# Patient Record
Sex: Female | Born: 1965
Health system: Southern US, Community
[De-identification: ages and names within clinical notes are randomized; demographics above are authoritative.]

## PROBLEM LIST (undated history)

## (undated) DIAGNOSIS — J45909 Unspecified asthma, uncomplicated: Secondary | ICD-10-CM

## (undated) HISTORY — PX: OTHER SURGICAL HISTORY: SHX169

---

## 2016-11-16 ENCOUNTER — Emergency Department
Admission: EM | Admit: 2016-11-16 | Discharge: 2016-11-16 | Disposition: A | Discharge status: Home / Self Care | Payer: No Typology Code available for payment source | Attending: Emergency Medicine | Admitting: Emergency Medicine

## 2016-11-16 ENCOUNTER — Encounter: Payer: Self-pay | Admitting: Emergency Medicine

## 2016-11-16 DIAGNOSIS — Y9389 Activity, other specified: Secondary | ICD-10-CM | POA: Diagnosis not present

## 2016-11-16 DIAGNOSIS — J45909 Unspecified asthma, uncomplicated: Secondary | ICD-10-CM | POA: Diagnosis not present

## 2016-11-16 DIAGNOSIS — Y92410 Unspecified street and highway as the place of occurrence of the external cause: Secondary | ICD-10-CM | POA: Diagnosis not present

## 2016-11-16 DIAGNOSIS — Y999 Unspecified external cause status: Secondary | ICD-10-CM | POA: Diagnosis not present

## 2016-11-16 DIAGNOSIS — F17210 Nicotine dependence, cigarettes, uncomplicated: Secondary | ICD-10-CM | POA: Insufficient documentation

## 2016-11-16 DIAGNOSIS — S199XXA Unspecified injury of neck, initial encounter: Secondary | ICD-10-CM | POA: Diagnosis present

## 2016-11-16 DIAGNOSIS — S161XXA Strain of muscle, fascia and tendon at neck level, initial encounter: Secondary | ICD-10-CM | POA: Diagnosis not present

## 2016-11-16 HISTORY — DX: Unspecified asthma, uncomplicated: J45.909

## 2016-11-16 MED ORDER — CYCLOBENZAPRINE HCL 5 MG PO TABS: 5.0000 mg | ORAL_TABLET | Freq: Three times a day (TID) | ORAL | 0 refills | Status: DC | PRN

## 2016-11-16 MED ORDER — IBUPROFEN 800 MG PO TABS: 800.0000 mg | ORAL_TABLET | Freq: Three times a day (TID) | ORAL | 0 refills | Status: DC | PRN

## 2016-11-16 NOTE — Discharge Instructions (Signed)
Please take medications as prescribed.  Follow-up with orthopedist if no improvement in 1 week.  Return to the emergency department for any increasing pain worsening symptoms or any changes in her health.

## 2016-11-16 NOTE — ED Notes (Signed)
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
## 2019-05-10 ENCOUNTER — Other Ambulatory Visit: Payer: Self-pay

## 2019-05-10 ENCOUNTER — Encounter: Payer: Self-pay | Admitting: *Deleted

## 2019-05-10 DIAGNOSIS — J45909 Unspecified asthma, uncomplicated: Secondary | ICD-10-CM | POA: Insufficient documentation

## 2019-05-10 DIAGNOSIS — K8020 Calculus of gallbladder without cholecystitis without obstruction: Secondary | ICD-10-CM | POA: Diagnosis not present

## 2019-05-10 DIAGNOSIS — F17210 Nicotine dependence, cigarettes, uncomplicated: Secondary | ICD-10-CM | POA: Diagnosis not present

## 2019-05-10 DIAGNOSIS — Z79899 Other long term (current) drug therapy: Secondary | ICD-10-CM | POA: Diagnosis not present

## 2019-05-10 DIAGNOSIS — R350 Frequency of micturition: Secondary | ICD-10-CM | POA: Diagnosis not present

## 2019-05-10 DIAGNOSIS — N200 Calculus of kidney: Secondary | ICD-10-CM | POA: Diagnosis not present

## 2019-05-10 DIAGNOSIS — R109 Unspecified abdominal pain: Secondary | ICD-10-CM | POA: Diagnosis not present

## 2019-05-10 LAB — URINALYSIS, COMPLETE (UACMP) WITH MICROSCOPIC
Bacteria, UA: NONE SEEN
Bilirubin Urine: NEGATIVE
Glucose, UA: NEGATIVE mg/dL
Ketones, ur: 20 mg/dL — AB
Leukocytes,Ua: NEGATIVE
Nitrite: NEGATIVE
Protein, ur: 100 mg/dL — AB
Specific Gravity, Urine: 1.015 (ref 1.005–1.030)
pH: 6 (ref 5.0–8.0)

## 2019-05-10 LAB — COMPREHENSIVE METABOLIC PANEL
ALT: 16 U/L (ref 0–44)
AST: 16 U/L (ref 15–41)
Albumin: 4.5 g/dL (ref 3.5–5.0)
Alkaline Phosphatase: 74 U/L (ref 38–126)
Anion gap: 9 (ref 5–15)
BUN: 8 mg/dL (ref 6–20)
CO2: 26 mmol/L (ref 22–32)
Calcium: 9.6 mg/dL (ref 8.9–10.3)
Chloride: 104 mmol/L (ref 98–111)
Creatinine, Ser: 0.5 mg/dL (ref 0.44–1.00)
GFR calc Af Amer: >60 mL/min (ref >60)
GFR calc non Af Amer: >60 mL/min (ref >60)
Glucose, Bld: 106 mg/dL — ABNORMAL HIGH (ref 70–99)
Potassium: 3.3 mmol/L — ABNORMAL LOW (ref 3.5–5.1)
Sodium: 139 mmol/L (ref 135–145)
Total Bilirubin: 0.8 mg/dL (ref 0.3–1.2)
Total Protein: 8 g/dL (ref 6.5–8.1)

## 2019-05-10 LAB — CBC
HCT: 42.8 % (ref 36.0–46.0)
Hemoglobin: 14.9 g/dL (ref 12.0–15.0)
MCH: 26.2 pg (ref 26.0–34.0)
MCHC: 34.8 g/dL (ref 30.0–36.0)
MCV: 75.2 fL — ABNORMAL LOW (ref 80.0–100.0)
Platelets: 289 10*3/uL (ref 150–400)
RBC: 5.69 MIL/uL — ABNORMAL HIGH (ref 3.87–5.11)
RDW: 15.4 % (ref 11.5–15.5)
WBC: 10.4 10*3/uL (ref 4.0–10.5)
nRBC: 0 % (ref 0.0–0.2)

## 2019-05-10 NOTE — ED Triage Notes (Signed)
[facility] for eval of left flank pain.  Sx for 3 days.  Pt reports nausea.  No hx of kidney stones   Pt alert.

## 2019-05-10 NOTE — ED Notes (Signed)
Pt unable to void at this time. 

## 2019-05-11 ENCOUNTER — Emergency Department: Payer: BLUE CROSS/BLUE SHIELD

## 2019-05-11 ENCOUNTER — Emergency Department
Admission: EM | Admit: 2019-05-11 | Discharge: 2019-05-11 | Disposition: A | Discharge status: Home / Self Care | Payer: BLUE CROSS/BLUE SHIELD | Attending: Emergency Medicine | Admitting: Emergency Medicine

## 2019-05-11 DIAGNOSIS — R109 Unspecified abdominal pain: Secondary | ICD-10-CM | POA: Diagnosis not present

## 2019-05-11 DIAGNOSIS — K8020 Calculus of gallbladder without cholecystitis without obstruction: Secondary | ICD-10-CM

## 2019-05-11 LAB — LIPASE, BLOOD: Lipase: 20 U/L (ref 11–51)

## 2019-05-11 MED ORDER — HYDROCODONE-ACETAMINOPHEN 5-325 MG PO TABS: 1.0000 | ORAL_TABLET | Freq: Four times a day (QID) | ORAL | 0 refills | Status: DC | PRN

## 2019-05-11 MED ORDER — CYCLOBENZAPRINE HCL 10 MG PO TABS: 10.0000 mg | ORAL_TABLET | Freq: Three times a day (TID) | ORAL | 0 refills | Status: DC | PRN

## 2019-05-11 MED ORDER — ONDANSETRON 4 MG PO TBDP
4.0000 mg | ORAL_TABLET | Freq: Once | ORAL | Status: AC
  Administered 2019-05-11: 4 mg via ORAL
  Filled 2019-05-11: qty 1

## 2019-05-11 MED ORDER — ONDANSETRON 4 MG PO TBDP: 4.0000 mg | ORAL_TABLET | Freq: Three times a day (TID) | ORAL | 0 refills | Status: DC | PRN

## 2019-05-11 MED ORDER — OXYCODONE-ACETAMINOPHEN 5-325 MG PO TABS
1.0000 | ORAL_TABLET | Freq: Once | ORAL | Status: AC
  Administered 2019-05-11: 1 via ORAL
  Filled 2019-05-11: qty 1

## 2019-05-11 NOTE — ED Provider Notes (Signed)
[facility] Emergency Department Provider Note _________   First MD Initiated Contact with Patient 05/11/19 0245     (approximate)  I have reviewed the triage vital signs and the nursing notes.   HISTORY  Chief Complaint Flank Pain   HPI Sheila Sanders is a 54 y.o. female presents to the emergency department secondary to a 3-day history of left flank pain  with associated nausea.  Patient denies any vomiting or diarrhea.  Patient denies any fever.  Patient was seen at urgent care earlier today and referred to the emergency department for further evaluation.  Patient denies any history of kidney stones in the past.  Current pain score of 9 out of 10.       Past Medical History:  Diagnosis Date  . Asthma     There are no problems to display for this patient.   Past Surgical History:  Procedure Laterality Date  . ceserian      Prior to Admission medications   Medication Sig Start Date End Date Taking? Authorizing Provider  cyclobenzaprine (FLEXERIL) 10 MG tablet Take 1 tablet (10 mg total) by mouth 3 (three) times daily as needed. 05/11/19   Gregor Hams, MD  cyclobenzaprine (FLEXERIL) 5 MG tablet Take 1-2 tablets (5-10 mg total) by mouth 3 (three) times daily as needed for muscle spasms. 11/16/16   Duanne Guess, PA-C  HYDROcodone-acetaminophen (NORCO/VICODIN) 5-325 MG tablet Take 1 tablet by mouth every 6 (six) hours as needed for moderate pain. 05/11/19   Gregor Hams, MD  ibuprofen (ADVIL,MOTRIN) 800 MG tablet Take 1 tablet (800 mg total) by mouth every 8 (eight) hours as needed. 11/16/16   Duanne Guess, PA-C  ondansetron (ZOFRAN ODT) 4 MG disintegrating tablet Take 1 tablet (4 mg total) by mouth every 8 (eight) hours as needed. 05/11/19   Gregor Hams, MD    Allergies Patient has no known allergies.  No family history on file.  Social History Social History   Tobacco Use  . Smoking status: Current Every Day Smoker      Packs/day: 0.50    Types: Cigarettes  . Smokeless tobacco: Never Used  Substance Use Topics  . Alcohol use: Yes    Comment: occas   . Drug use: No    Review of Systems Constitutional: No fever/chills Eyes: No visual changes. ENT: No sore throat. Cardiovascular: Denies chest pain. Respiratory: Denies shortness of breath. Gastrointestinal: Positive for left flank pain.  No nausea, no vomiting.  No diarrhea.  No constipation. Genitourinary: Negative for dysuria. Musculoskeletal: Negative for neck pain.  Negative for back pain. Integumentary: Negative for rash. Neurological: Negative for headaches, focal weakness or numbness.   ____________________________________________   PHYSICAL EXAM:  VITAL SIGNS: ED Triage Vitals [05/10/19 2113]  Enc Vitals Group     BP (!) 146/93     Pulse Rate 90     Resp 18     Temp 98.7 F (37.1 C)     Temp src      SpO2 98 %     Weight 113.4 kg (250 lb)     Height 1.702 m (5\' 7" )     Head Circumference      Peak Flow      Pain Score 9     Pain Loc      Pain Edu?      Excl. in [place]?     Constitutional: Alert and oriented.  Eyes: Conjunctivae are normal.  Mouth/Throat: Patient  is wearing a mask. Neck: No stridor.  No meningeal signs.   Cardiovascular: Normal rate, regular rhythm. Good peripheral circulation. Grossly normal heart sounds. Respiratory: Normal respiratory effort.  No retractions. Gastrointestinal: Soft and nontender. No distention.  No CVA tenderness Musculoskeletal: No lower extremity tenderness nor edema. No gross deformities of extremities. Neurologic:  Normal speech and language. No gross focal neurologic deficits are appreciated.  Skin:  Skin is warm, dry and intact. Psychiatric: Mood and affect are normal. Speech and behavior are normal.  ____________________________________________   LABS (all labs ordered are listed, but only abnormal results are displayed)  Labs Reviewed  COMPREHENSIVE METABOLIC PANEL -  Abnormal; Notable for the following components:      Result Value   Potassium 3.3 (*)    Glucose, Bld 106 (*)    All other components within normal limits  CBC - Abnormal; Notable for the following components:   RBC 5.69 (*)    MCV 75.2 (*)    All other components within normal limits  URINALYSIS, COMPLETE (UACMP) WITH MICROSCOPIC - Abnormal; Notable for the following components:   Color, Urine YELLOW (*)    APPearance CLEAR (*)    Hgb urine dipstick SMALL (*)    Ketones, ur 20 (*)    Protein, ur 100 (*)    All other components within normal limits  LIPASE, BLOOD   ___________ RADIOLOGY I, [place] N Danyale Ridinger, personally viewed and evaluated these images (plain radiographs) as part of my medical decision making, as well as reviewing the written report by the radiologist.  ED MD interpretation: No renal urethral stones no hydronephrosis no acute findings, cholelithiasis on CT abdomen pelvis impression per radiologist  Official radiology report(s): CT Renal Stone Study  Result Date: 05/11/2019 CLINICAL DATA:  Left flank pain EXAM: CT ABDOMEN AND PELVIS WITHOUT CONTRAST TECHNIQUE: Multidetector CT imaging of the abdomen and pelvis was performed following the standard protocol without IV contrast. COMPARISON:  None. FINDINGS: Lower chest: Lung bases are clear. No effusions. Heart is normal size. Hepatobiliary: Small layering gallstone within the gallbladder. No focal hepatic abnormality. Pancreas: No focal abnormality or ductal dilatation. Spleen: No focal abnormality.  Normal size. Adrenals/Urinary Tract: No adrenal abnormality. No focal renal abnormality. No stones or hydronephrosis. Urinary bladder is unremarkable. Stomach/Bowel: Normal appendix. Stomach, large and small bowel grossly unremarkable. Vascular/Lymphatic: No evidence of aneurysm or adenopathy. Reproductive: Uterus and adnexa unremarkable.  No mass. Other: No free fluid or free air. Musculoskeletal: No acute bony abnormality.  IMPRESSION: No renal or ureteral stones.  No hydronephrosis. No acute findings. Cholelithiasis. Electronically Signed   By: Charlett Nose M.D.   On: 05/11/2019 03:52      Procedures   ____________________________________________   INITIAL IMPRESSION / MDM / ASSESSMENT AND PLAN / ED COURSE  As part of my medical decision making, I reviewed the following data within the electronic MEDICAL RECORD NUMBER   54 year old female presented with above-stated history and physical exam a differential diagnosis including but not limited to ureterolithiasis, pyelonephritis, diverticulitis/diverticulosis.  CT renal study revealed no evidence of ureterolithiasis however did reveal evidence of cholelithiasis patient without any right upper quadrant abdominal pain on exam.  Spoke with the patient at length regarding all clinical findings and notified her of finding of cholelithiasis for which she will be referred to general surgery.  Patient given Percocet and Zofran in the emergency department will be prescribed the same for home.  ____________________________________________  FINAL CLINICAL IMPRESSION(S) / ED DIAGNOSES  Final diagnoses:  Left  flank pain  Calculus of gallbladder without cholecystitis without obstruction     MEDICATIONS GIVEN DURING THIS VISIT:  Medications  ondansetron (ZOFRAN-ODT) disintegrating tablet 4 mg (4 mg Oral Given 05/11/19 0452)  oxyCODONE-acetaminophen (PERCOCET/ROXICET) 5-325 MG per tablet 1 tablet (1 tablet Oral Given 05/11/19 0452)     ED Discharge Orders         Ordered    HYDROcodone-acetaminophen (NORCO/VICODIN) 5-325 MG tablet  Every 6 hours PRN     05/11/19 0457    ondansetron (ZOFRAN ODT) 4 MG disintegrating tablet  Every 8 hours PRN     05/11/19 0457    cyclobenzaprine (FLEXERIL) 10 MG tablet  3 times daily PRN     05/11/19 0522          *Please note:  Sheila Sanders was evaluated in Emergency Department on 05/11/2019 for the symptoms described in the  history of present illness. She was evaluated in the context of the global COVID-19 pandemic, which necessitated consideration that the patient might be at risk for infection with the SARS-CoV-2 virus that causes COVID-19. Institutional protocols and algorithms that pertain to the evaluation of patients at risk for COVID-19 are in a state of rapid change based on information released by regulatory bodies including the CDC and federal and state organizations. These policies and algorithms were followed during the patient's care in the ED.  Some ED evaluations and interventions may be delayed as a result of limited staffing during the pandemic.*  Note:  This document was prepared using Dragon voice recognition software and may include unintentional dictation errors.   Darci Current, MD 05/11/19 2201
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
## 2019-05-26 ENCOUNTER — Ambulatory Visit (INDEPENDENT_AMBULATORY_CARE_PROVIDER_SITE_OTHER): Payer: BLUE CROSS/BLUE SHIELD | Admitting: Internal Medicine

## 2019-05-26 ENCOUNTER — Encounter: Payer: Self-pay | Admitting: Internal Medicine

## 2019-05-26 ENCOUNTER — Other Ambulatory Visit: Payer: Self-pay

## 2019-05-26 VITALS — BP 126/74 | HR 100 | Temp 97.9°F | Resp 16 | Ht 67.0 in | Wt 257.4 lb

## 2019-05-26 DIAGNOSIS — Z800 Family history of malignant neoplasm of digestive organs: Secondary | ICD-10-CM | POA: Diagnosis not present

## 2019-05-26 DIAGNOSIS — K8020 Calculus of gallbladder without cholecystitis without obstruction: Secondary | ICD-10-CM

## 2019-05-26 DIAGNOSIS — Z13220 Encounter for screening for lipoid disorders: Secondary | ICD-10-CM

## 2019-05-26 DIAGNOSIS — J4520 Mild intermittent asthma, uncomplicated: Secondary | ICD-10-CM | POA: Diagnosis not present

## 2019-05-26 DIAGNOSIS — Z131 Encounter for screening for diabetes mellitus: Secondary | ICD-10-CM

## 2019-05-26 DIAGNOSIS — Z09 Encounter for follow-up examination after completed treatment for conditions other than malignant neoplasm: Secondary | ICD-10-CM

## 2019-05-26 DIAGNOSIS — Z7689 Persons encountering health services in other specified circumstances: Secondary | ICD-10-CM

## 2019-05-26 DIAGNOSIS — Z9189 Other specified personal risk factors, not elsewhere classified: Secondary | ICD-10-CM

## 2019-05-26 DIAGNOSIS — Z1329 Encounter for screening for other suspected endocrine disorder: Secondary | ICD-10-CM

## 2019-05-26 DIAGNOSIS — Z6841 Body Mass Index (BMI) 40.0 and over, adult: Secondary | ICD-10-CM

## 2019-05-26 DIAGNOSIS — F17200 Nicotine dependence, unspecified, uncomplicated: Secondary | ICD-10-CM | POA: Insufficient documentation

## 2019-05-26 NOTE — Patient Instructions (Signed)

## 2020-07-16 ENCOUNTER — Ambulatory Visit: Payer: Self-pay

## 2020-07-16 NOTE — Telephone Encounter (Signed)
Patient called, no answer, mailbox is full, unable to leave message. Attempted x 3 by [facility] NT, will route to the office for resolution.   Summary: Ringing in her head   Pt called to report that she has a ringing in her head, a constant radio sound. Pt reports no pain, says this has been for a few weeks   Best contact: [phone]

## 2020-09-18 NOTE — Progress Notes (Addendum)
Name: Sheila Sanders   MRN: [id number]    DOB: 1965-06-02   Date:09/19/2020       Progress Note  Subjective  Chief Complaint  Physical   HPI  Patient presents for annual CPE, and acute issues, aware of potential extra cost.   Tinnitus: Constant buzzing sound in head.  She noticed it a few months ago.  Denies any pain, no trauma.  Worsened by fax machine sound.   Asthma: Has continued to get worse over the summer months.  She noticed wheezing and chest tightness.  She says she has a flare about 2-3 times a week.  She denies waking up in the middle of the night wheezing.  Will start Claritin OTC at night. Start Albuterol inhaler PRN.    Right bottom dental pain:  Dental pain for several weeks. Has an appointment coming up with the dentist in a week.    Diet: Balanced diet, fruits, vegetables, lean meats, fish, dairy and hot dogs. Exercise: stairs a couple times a week. Advised to try and get 150 minutes a week of physical exercise.    [place] Visit from 05/26/2019 in [facility]  AUDIT-C Score 1      Depression: Phq 9 is  negative Depression screen [facility] 2/9 09/19/2020 05/26/2019  Decreased Interest 1 0  Down, Depressed, Hopeless 0 0  PHQ - 2 Score 1 0  Altered sleeping 0 0  Tired, decreased energy 0 0  Change in appetite 0 0  Feeling bad or failure about yourself  0 0  Trouble concentrating 0 0  Moving slowly or fidgety/restless 0 0  Suicidal thoughts 0 0  PHQ-9 Score 1 0  Difficult doing work/chores Not difficult at all Not difficult at all   Hypertension: BP Readings from Last 3 Encounters:  09/19/20 118/72  09/19/20 128/72  05/26/19 126/74   Obesity: Wt Readings from Last 3 Encounters:  09/19/20 258 lb 12.8 oz (117.4 kg)  09/19/20 260 lb 12.8 oz (118.3 kg)  05/26/19 257 lb 6.4 oz (116.8 kg)   BMI Readings from Last 3 Encounters:  09/19/20 40.53 kg/m  09/19/20 40.85 kg/m  05/26/19 40.31 kg/m     Vaccines:   Shingrix: 76-64 y  educated and discussed with patient.  Declined at this time.  Pneumonia:  educated and discussed with patient. Declined at this time. Flu: educated and discussed with patient. Declined at this time.   Hep C Screening: not interested STD testing and prevention (HIV/chl/gon/syphilis): not interested Intimate partner violence:no violence, safe at home Sexual History : one partner, husband Menstrual History/LMP/Abnormal Bleeding: postmenopausal, discussed if she has vaginal bleeding to see [place] or GYN.  Incontinence Symptoms: no incontinence symptoms  Breast cancer:  - Last Mammogram: Ordered today by Dr Gilman Schmidt - BRCA gene screening: no family history of breast cancer  Osteoporosis: Discussed high calcium and vitamin D supplementation, weight bearing exercises  Cervical cancer screening: Done today by Dr Gilman Schmidt  Skin cancer: Discussed monitoring for atypical lesions Colorectal cancer: Color guard ordered today by Dr. Gilman Schmidt Lung cancer:  ordered today by Dr. Gilman Schmidt Low Dose CT Chest recommended if Age 41-80 years, 37 pack-year currently smoking OR have quit w/in 15years. Patient does qualify.     Advanced Care Planning: A voluntary discussion about advance care planning including the explanation and discussion of advance directives.  Discussed health care proxy and Living will, and the patient was able to identify a health care proxy as husband.    Lipids: No  results found for: CHOL No results found for: HDL No results found for: LDLCALC No results found for: TRIG No results found for: CHOLHDL No results found for: LDLDIRECT  Glucose: Glucose, Bld  Date Value Ref Range Status  05/10/2019 106 (H) 70 - 99 mg/dL Final    Comment:    Glucose reference range applies only to samples taken after fasting for at least 8 hours.    Patient Active Problem List   Diagnosis Date Noted   Family history of colon cancer in father 05/26/2019   Calculus of gallbladder without cholecystitis  without obstruction 05/26/2019   Mild intermittent asthma without complication 76/19/5093   Class 3 severe obesity due to excess calories with body mass index (BMI) of 40.0 to 44.9 in adult [facility]) 05/26/2019   Tobacco dependence 05/26/2019   Sedentary lifestyle 05/26/2019    Past Surgical History:  Procedure Laterality Date   CESAREAN SECTION     ceserian      Family History  Problem Relation Age of Onset   Hypertension Mother    Renal Disease Father     Social History   Socioeconomic History   Marital status: Married    Spouse name: Cornell   Number of children: Not on file   Years of education: Not on file   Highest education level: Not on file  Occupational History   Not on file  Tobacco Use   Smoking status: Every Day    Packs/day: 0.50    Types: Cigarettes   Smokeless tobacco: Never  Vaping Use   Vaping Use: Never used  Substance and Sexual Activity   Alcohol use: Yes    Comment: occas    Drug use: No   Sexual activity: Yes    Partners: Male  Other Topics Concern   Not on file  Social History Narrative   Works at [employer] Strain: Not on file  Food Insecurity: Not on file  Transportation Needs: Not on file  Physical Activity: Not on file  Stress: Not on file  Social Connections: Not on file  Intimate Partner Violence: Not At Risk   Fear of Current or Ex-Partner: No   Emotionally Abused: No   Physically Abused: No   Sexually Abused: No    No current outpatient medications on file.  No Known Allergies   ROS  Constitutional: Negative for fever or weight change.  Respiratory: Negative for cough and shortness of breath, positive for wheezing  Cardiovascular: Positive for chest tightness, negative for palpitations.  Gastrointestinal: Negative for abdominal pain, no bowel changes.  Musculoskeletal: Negative for gait problem or joint swelling, positive for right wrist pain  Skin: Positive for rash on  back.  Neurological: Negative for dizziness or headache.  No other specific complaints in a complete review of systems (except as listed in HPI above).   Objective  Vitals:   09/19/20 1322  BP: 118/72  Pulse: 97  Temp: 98.2 F (36.8 C)  SpO2: 96%  Weight: 258 lb 12.8 oz (117.4 kg)  Height: _0  (1.702 m)    Body mass index is 40.53 kg/m.  Physical Exam     Constitutional: Patient appears well-developed and well-nourished. No distress.  HENT: Head: Normocephalic and atraumatic. Ears: B TMs effusion noted, no erythema; Nose: Nose normal. Mouth/Throat: Oropharynx is clear and moist. No oropharyngeal exudate. Right lower dental carries, gum swollen and inflamed. Eyes: Conjunctivae and EOM are normal. Pupils are equal, round, and  reactive to light. No scleral icterus.  Neck: Normal range of motion. Neck supple. No JVD present. No thyromegaly present.  Cardiovascular: Normal rate, regular rhythm and normal heart sounds.  No murmur heard. No BLE edema. Pulmonary/Chest: Effort normal and breath sounds normal. No respiratory distress. Abdominal: Soft. Bowel sounds are normal, no distension. There is no tenderness. no masses Breast: not done FEMALE GENITALIA: not done RECTAL: not done Musculoskeletal: Normal range of motion, no joint effusions. No gross deformities Neurological: he is alert and oriented to person, place, and time. No cranial nerve deficit. Coordination, balance, strength, speech and gait are normal.  Skin: Skin is warm and dry. Rash noted to lower back. No erythema.  Psychiatric: Patient has a normal mood and affect. behavior is normal. Judgment and thought content normal.   Fall Risk: Fall Risk  09/19/2020 05/26/2019  Falls in the past year? 0 0  Number falls in past yr: 0 0  Injury with Fall? 0 0  Follow up - Falls evaluation completed     Functional Status Survey: Is the patient deaf or have difficulty hearing?: No Does the patient have difficulty seeing, even  when wearing glasses/contacts?: Yes Does the patient have difficulty concentrating, remembering, or making decisions?: No Does the patient have difficulty walking or climbing stairs?: No Does the patient have difficulty dressing or bathing?: No Does the patient have difficulty doing errands alone such as visiting a doctor's office or shopping?: No   Assessment & Plan  1. Encounter for wellness examination  - CBC with Differential/Platelet - COMPLETE METABOLIC PANEL WITH GFR - Lipid panel - TSH - Hemoglobin A1c  2. Mild intermittent asthma, unspecified whether complicated  - CBC with Differential/Platelet - COMPLETE METABOLIC PANEL WITH GFR - albuterol (VENTOLIN HFA) 108 (90 Base) MCG/ACT inhaler; Inhale 2 puffs into the lungs every 6 (six) hours as needed for wheezing or shortness of breath.  Dispense: 8 g; Refill: 0 - start loratidine OTC 3. Screening for thyroid disorder  - TSH  4. Screening for diabetes mellitus  - CBC with Differential/Platelet - COMPLETE METABOLIC PANEL WITH GFR - Hemoglobin A1c  5. Screening for lipid disorders  - Lipid panel  6. Tinnitus of both ears/ acute middle ear effusion -start loratidine OTC, maybe associated with bilateral ear effusion  7. Dental infection - she is also going to see the dentist in a week - amoxicillin-clavulanate (AUGMENTIN) 875-125 MG tablet; Take 1 tablet by mouth 2 (two) times daily for 14 days.  Dispense: 28 tablet; Refill: 0   8. Screening for deficiency anemia - CBC with Differential/Platelet    -USPSTF grade A and B recommendations reviewed with patient; age-appropriate recommendations, preventive care, screening tests, etc discussed and encouraged; healthy living encouraged; see AVS for patient education given to patient -Discussed importance of 150 minutes of physical activity weekly, eat two servings of fish weekly, eat one serving of tree nuts ( cashews, pistachios, pecans, almonds.Marland Kitchen) every other day, eat 6  servings of fruit/vegetables daily and drink plenty of water and avoid sweet beverages.

## 2020-09-19 ENCOUNTER — Encounter: Payer: Self-pay | Admitting: Obstetrics and Gynecology

## 2020-09-19 ENCOUNTER — Encounter: Payer: Self-pay | Admitting: Nurse Practitioner

## 2020-09-19 ENCOUNTER — Ambulatory Visit (INDEPENDENT_AMBULATORY_CARE_PROVIDER_SITE_OTHER): Payer: BC Managed Care – PPO | Admitting: Obstetrics and Gynecology

## 2020-09-19 ENCOUNTER — Ambulatory Visit (INDEPENDENT_AMBULATORY_CARE_PROVIDER_SITE_OTHER): Payer: BC Managed Care – PPO | Admitting: Nurse Practitioner

## 2020-09-19 ENCOUNTER — Other Ambulatory Visit: Payer: Self-pay

## 2020-09-19 ENCOUNTER — Other Ambulatory Visit (HOSPITAL_COMMUNITY)
Admission: RE | Admit: 2020-09-19 | Discharge: 2020-09-19 | Disposition: A | Discharge status: Home / Self Care | Payer: BC Managed Care – PPO | Source: Ambulatory Visit | Attending: Obstetrics and Gynecology | Admitting: Obstetrics and Gynecology

## 2020-09-19 VITALS — BP 128/72 | Ht 67.0 in | Wt 260.8 lb

## 2020-09-19 VITALS — BP 118/72 | HR 97 | Temp 98.2°F | Ht 67.0 in | Wt 258.8 lb

## 2020-09-19 DIAGNOSIS — H65193 Other acute nonsuppurative otitis media, bilateral: Secondary | ICD-10-CM

## 2020-09-19 DIAGNOSIS — Z1231 Encounter for screening mammogram for malignant neoplasm of breast: Secondary | ICD-10-CM | POA: Diagnosis not present

## 2020-09-19 DIAGNOSIS — Z122 Encounter for screening for malignant neoplasm of respiratory organs: Secondary | ICD-10-CM

## 2020-09-19 DIAGNOSIS — Z131 Encounter for screening for diabetes mellitus: Secondary | ICD-10-CM

## 2020-09-19 DIAGNOSIS — Z0000 Encounter for general adult medical examination without abnormal findings: Secondary | ICD-10-CM

## 2020-09-19 DIAGNOSIS — Z01419 Encounter for gynecological examination (general) (routine) without abnormal findings: Secondary | ICD-10-CM

## 2020-09-19 DIAGNOSIS — Z1151 Encounter for screening for human papillomavirus (HPV): Secondary | ICD-10-CM | POA: Insufficient documentation

## 2020-09-19 DIAGNOSIS — Z124 Encounter for screening for malignant neoplasm of cervix: Secondary | ICD-10-CM | POA: Diagnosis not present

## 2020-09-19 DIAGNOSIS — Z118 Encounter for screening for other infectious and parasitic diseases: Secondary | ICD-10-CM | POA: Insufficient documentation

## 2020-09-19 DIAGNOSIS — J4520 Mild intermittent asthma, uncomplicated: Secondary | ICD-10-CM

## 2020-09-19 DIAGNOSIS — Z1239 Encounter for other screening for malignant neoplasm of breast: Secondary | ICD-10-CM

## 2020-09-19 DIAGNOSIS — Z13220 Encounter for screening for lipoid disorders: Secondary | ICD-10-CM

## 2020-09-19 DIAGNOSIS — K047 Periapical abscess without sinus: Secondary | ICD-10-CM

## 2020-09-19 DIAGNOSIS — H9313 Tinnitus, bilateral: Secondary | ICD-10-CM

## 2020-09-19 DIAGNOSIS — Z1329 Encounter for screening for other suspected endocrine disorder: Secondary | ICD-10-CM | POA: Diagnosis not present

## 2020-09-19 DIAGNOSIS — Z130 Encounter for screening for diseases of the blood and blood-forming organs and certain disorders involving the immune mechanism: Secondary | ICD-10-CM

## 2020-09-19 DIAGNOSIS — Z1211 Encounter for screening for malignant neoplasm of colon: Secondary | ICD-10-CM

## 2020-09-19 MED ORDER — ALBUTEROL SULFATE HFA 108 (90 BASE) MCG/ACT IN AERS: 2.0000 | INHALATION_SPRAY | Freq: Four times a day (QID) | RESPIRATORY_TRACT | 0 refills | Status: DC | PRN

## 2020-09-19 MED ORDER — AMOXICILLIN-POT CLAVULANATE 875-125 MG PO TABS: 1.0000 | ORAL_TABLET | Freq: Two times a day (BID) | ORAL | 0 refills | Status: AC

## 2020-09-19 NOTE — Progress Notes (Signed)
Gynecology Annual Exam  PCP: Berniece Salines, FNP  Chief Complaint:  Chief Complaint  Patient presents with   Gynecologic Exam    History of Present Illness: Patient is a 55 y.o. [id number] presents for annual exam. The patient has no complaints today.   LMP: No LMP recorded. Patient is postmenopausal. She denies postmenopausal bleeding or spotting  The patient is sexually active. She reports dyspareunia.  Postcoital Bleeding: no   The patient does perform self breast exams.  There is notable family history of breast or ovarian cancer in her family.  The patient has regular exercise: goes up and down steps at home when doing chores.   The patient denies current symptoms of depression.   Review of Systems: Review of Systems  Constitutional:  Negative for chills, fever, malaise/fatigue and weight loss.  HENT:  Positive for hearing loss. Negative for congestion and sinus pain.   Eyes:  Negative for blurred vision and double vision.  Respiratory:  Positive for cough, shortness of breath and wheezing. Negative for sputum production.   Cardiovascular:  Negative for chest pain, palpitations, orthopnea and leg swelling.  Gastrointestinal:  Negative for abdominal pain, constipation, diarrhea, nausea and vomiting.  Genitourinary:  Negative for dysuria, flank pain, frequency, hematuria and urgency.  Musculoskeletal:  Negative for back pain, falls and joint pain.  Skin:  Negative for itching and rash.  Neurological:  Positive for headaches. Negative for dizziness.  Psychiatric/Behavioral:  Negative for depression, substance abuse and suicidal ideas. The patient is not nervous/anxious.    Past Medical History:  Past Medical History:  Diagnosis Date   Asthma     Past Surgical History:  Past Surgical History:  Procedure Laterality Date   CESAREAN SECTION     ceserian      Gynecologic History:  No LMP recorded. Patient is postmenopausal. Last pap: None in system.  Patient  believes her last Pap smear was in 2010. Last mammogram: No mammogram results in the system.  Patient believes her last mammogram was in 2008.    Obstetric History: [id number]  Family History:  Family History  Problem Relation Age of Onset   Hypertension Mother    Renal Disease Father     Social History:  Social History   Socioeconomic History   Marital status: Married    Spouse name: Cornell   Number of children: Not on file   Years of education: Not on file   Highest education level: Not on file  Occupational History   Not on file  Tobacco Use   Smoking status: Every Day    Packs/day: 0.50    Types: Cigarettes   Smokeless tobacco: Never  Vaping Use   Vaping Use: Never used  Substance and Sexual Activity   Alcohol use: Yes    Comment: occas    Drug use: No   Sexual activity: Yes    Partners: Male  Other Topics Concern   Not on file  Social History Narrative   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 Authorizing Provider  cyclobenzaprine (FLEXERIL) 10 MG tablet Take 1 tablet (  10 mg total) by mouth 3 (three) times daily as needed. Patient not taking: Reported on 05/26/2019 05/11/19   Darci Current, MD  cyclobenzaprine (FLEXERIL) 5 MG tablet Take 1-2 tablets (5-10 mg total) by mouth 3 (three) times daily as needed for muscle spasms. Patient not taking: Reported on 05/26/2019 11/16/16   Evon Slack, PA-C  HYDROcodone-acetaminophen (NORCO/VICODIN) 5-325 MG tablet Take 1 tablet by mouth every 6 (six) hours as needed for moderate pain. Patient not taking: Reported on 05/26/2019 05/11/19   Darci Current,  MD  ibuprofen (ADVIL,MOTRIN) 800 MG tablet Take 1 tablet (800 mg total) by mouth every 8 (eight) hours as needed. Patient not taking: Reported on 05/26/2019 11/16/16   Evon Slack, PA-C  ondansetron (ZOFRAN ODT) 4 MG disintegrating tablet Take 1 tablet (4 mg total) by mouth every 8 (eight) hours as needed. Patient not taking: Reported on 05/26/2019 05/11/19   Darci Current, MD    Physical Exam Vitals: Blood pressure 128/72, height 5\' 7"  (1.702 m), weight 260 lb 12.8 oz (118.3 kg).  Physical Exam Constitutional:      Appearance: She is well-developed.  Genitourinary:     Genitourinary Comments: External: Normal appearing vulva. No lesions noted.  Speculum examination: Normal appearing cervix. No blood in the vaginal vault. No discharge.  Bimanual examination: Uterus midline, non-tender, normal in size, shape and contour.  No CMT. No adnexal masses. No adnexal tenderness. Pelvis not fixed.  Breast Exam: breast equal without skin changes, nipple discharge, breast lump or enlarged lymph nodes   HENT:     Head: Normocephalic and atraumatic.  Neck:     Thyroid: No thyromegaly.  Cardiovascular:     Rate and Rhythm: Normal rate and regular rhythm.     Heart sounds: Normal heart sounds.  Pulmonary:     Effort: Pulmonary effort is normal.     Breath sounds: Normal breath sounds.  Abdominal:     General: Bowel sounds are normal. There is no distension.     Palpations: Abdomen is soft. There is no mass.  Musculoskeletal:     Cervical back: Neck supple.  Neurological:     Mental Status: She is alert and oriented to person, place, and time.  Skin:    General: Skin is warm and dry.  Psychiatric:        Behavior: Behavior normal.        Thought Content: Thought content normal.        Judgment: Judgment normal.  Vitals reviewed.     Female chaperone present for pelvic and breast  portions of the physical exam  Assessment: 55 y.o. 41 routine annual exam  Plan: Problem List  Items Addressed This Visit   None Visit Diagnoses     Encounter for annual routine gynecological examination    -  Primary   Health maintenance examination       Breast cancer screening by mammogram       Cervical cancer screening       Colon cancer screening       Encounter for gynecological examination without abnormal finding       Encounter for screening breast examination           1) Mammogram - recommend yearly screening mammogram.  Mammogram was ordered today  2) STI screening was offered and declined.  3) ASCCP guidelines and rational discussed.  Pap smear performed today.   4) Colonoscopy -- recommended. Patient adverse to prep and would like cologuard  testing, order placed.   5) Routine healthcare maintenance including cholesterol, diabetes screening discussed managed by PCP. She has a visit with them today  6) Osteoporosis screening - no increased risk factors  7) Lung cancer screening-patient reports that she has smoked 1 to 2 packs of cigarettes daily for the last 40 years.  She is currently a smoker.  She is not interested in quitting.  Referral placed for lung cancer screening.  Adelene Idler MD, [facility] 09/19/2020 8:39 AM

## 2020-09-19 NOTE — Patient Instructions (Signed)
Volteran gel  Clairtin

## 2020-09-19 NOTE — Patient Instructions (Addendum)
Vaginal/ Vulvar Moisturizer Use 3-5 times a week at bedtime Hyalo Gyn Revaree Replens Lisette Grinder Key-E suppositories Vitamin E oil, olive oil, coconut oil   Water-based Lubricants  Astroglide [place] Jelly  Luvena Aquagel  Silicone- based Lubricants Pjur PINK Astroglide silicone Uberlube            Institute of Medicine Recommended Dietary Allowances for Calcium and Vitamin D  Age (yr) Calcium Recommended Dietary Allowance (mg/day) Vitamin D Recommended Dietary Allowance (international units/day)  9-18 1,300 600  19-50 1,000 600  51-70 1,200 600  71 and older 1,200 800  Data from Institute of Medicine. Dietary reference intakes: calcium, vitamin D. [place], [place]: [employer]; 2011.      Exercising to Stay Healthy To become healthy and stay healthy, it is recommended that you do moderate-intensity and vigorous-intensity exercise. You can tell that you are exercising at a moderate intensity if your heart starts beating faster and you start breathing faster but can still hold a conversation. You can tell that you are exercising at a vigorous intensity if you are breathing much harder and faster and cannot hold a conversation while exercising. How can exercise benefit me? Exercising regularly is important. It has many health benefits, such as: Improving overall fitness, flexibility, and endurance. Increasing bone density. Helping with weight control. Decreasing body fat. Increasing muscle strength and endurance. Reducing stress and tension, anxiety, depression, or anger. Improving overall health. What guidelines should I follow while exercising? Before you start a new exercise program, talk with your health care provider. Do not exercise so much that you hurt yourself, feel dizzy, or get very short of breath. Wear comfortable clothes and wear shoes with good support. Drink plenty of water while you exercise to prevent dehydration or heat stroke. Work out  until your breathing and your heartbeat get faster (moderate intensity). How often should I exercise? Choose an activity that you enjoy, and set realistic goals. Your health care provider can help you make an activity plan that is individually designed and works best for you. Exercise regularly as told by your health care provider. This may include: Doing strength training two times a week, such as: Lifting weights. Using resistance bands. Push-ups. Sit-ups. Yoga. Doing a certain intensity of exercise for a given amount of time. Choose from these options: A total of 150 minutes of moderate-intensity exercise every week. A total of 75 minutes of vigorous-intensity exercise every week. A mix of moderate-intensity and vigorous-intensity exercise every week. Children, pregnant women, people who have not exercised regularly, people who are overweight, and older adults may need to talk with a health care provider about what activities are safe to perform. If you have a medical condition, be sure to talk with your health care provider before you start a new exercise program. What are some exercise ideas? Moderate-intensity exercise ideas include: Walking 1 mile (1.6 km) in about 15 minutes. Biking. Hiking. Golfing. Dancing. Water aerobics. Vigorous-intensity exercise ideas include: Walking 4.5 miles (7.2 km) or more in about 1 hour. Jogging or running 5 miles (8 km) in about 1 hour. Biking 10 miles (16.1 km) or more in about 1 hour. Lap swimming. Roller-skating or in-line skating. Cross-country skiing. Vigorous competitive sports, such as football, basketball, and soccer. Jumping rope. Aerobic dancing. What are some everyday activities that can help me get exercise? Yard work, such as: Child psychotherapist. Raking and bagging leaves. Washing your car. Pushing a stroller. Shoveling snow. Gardening. Washing windows or floors. How can I  be more active in my day-to-day activities? Use  stairs instead of an elevator. Take a walk during your lunch break. If you drive, park your car farther away from your work or school. If you take public transportation, get off one stop early and walk the rest of the way. Stand up or walk around during all of your indoor phone calls. Get up, stretch, and walk around every 30 minutes throughout the day. Enjoy exercise with a friend. Support to continue exercising will help you keep a regular routine of activity. Where to find more information You can find more information about exercising to stay healthy from: U.S. Department of Health and Human Services: ThisPath.fi Centers for Disease Control and Prevention (CDC): FootballExhibition.com.br Summary Exercising regularly is important. It will improve your overall fitness, flexibility, and endurance. Regular exercise will also improve your overall health. It can help you control your weight, reduce stress, and improve your bone density. Do not exercise so much that you hurt yourself, feel dizzy, or get very short of breath. Before you start a new exercise program, talk with your health care provider. This information is not intended to replace advice given to you by your health care provider. Make sure you discuss any questions you have with your health care provider. Document Revised: 05/03/2020 Document Reviewed: 05/03/2020 Elsevier Patient Education  2022 Elsevier Inc. Budget-Friendly Healthy Eating There are many ways to save money at the grocery store and continue to eat healthy. You can be successful if you: Plan meals according to your budget. Make a grocery list and only purchase food according to your grocery list. Prepare food yourself at home. What are tips for following this plan? Reading food labels Compare food labels between brand name foods and the store brand. Often the nutritional value is the same, but the store brand is lower cost. Look for products that do not have added sugar, fat,  or salt (sodium). These often cost the same but are healthier for you. Products may be labeled as: Sugar-free. Nonfat. Low-fat. Sodium-free. Low-sodium. Look for lean ground beef labeled as at least 92% lean and 8% fat. Shopping  Buy only the items on your grocery list and go only to the areas of the store that have the items on your list. Use coupons only for foods and brands you normally buy. Avoid buying items you wouldn't normally buy simply because they are on sale. Check online and in newspapers for weekly deals. Buy healthy items from the bulk bins when available, such as herbs, spices, flour, pasta, nuts, and dried fruit. Buy fruits and vegetables that are in season. Prices are usually lower on in-season produce. Look at the unit price on the price tag. Use it to compare different brands and sizes to find out which item is the best deal. Choose healthy items that are often low-cost, such as carrots, potatoes, apples, bananas, and oranges. Dried or canned beans are a low-cost protein source. Buy in bulk and freeze extra food. Items you can buy in bulk include meats, fish, poultry, frozen fruits, and frozen vegetables. Avoid buying "ready-to-eat" foods, such as pre-cut fruits and vegetables and pre-made salads. If possible, shop around to discover where you can find the best prices. Consider other retailers such as dollar stores, larger [employer], local fruit and vegetable stands, and farmers markets. Do not shop when you are hungry. If you shop while hungry, it may be hard to stick to your list and budget. Resist impulse buying. Use your  grocery list as your official plan for the week. Buy a variety of vegetables and fruits by purchasing fresh, frozen, and canned items. Look at the top and bottom shelves for deals. Foods at eye level (eye level of an adult or child) are usually more expensive. Be efficient with your time when shopping. The more time you spend at the store, the  more money you are likely to spend. To save money when choosing more expensive foods like meats and dairy: Choose cheaper cuts of meat, such as bone-in chicken thighs and drumsticks instead of skinless and boneless chicken. When you are ready to prepare the chicken, you can remove the skin yourself to make it healthier. Choose lean meats like chicken or [place] instead of beef. Choose canned seafood, such as tuna, salmon, or sardines. Buy eggs as a low-cost source of protein. Buy dried beans and peas, such as lentils, split peas, or kidney beans instead of meats. Dried beans and peas are a good alternative source of protein. Buy the larger tubs of yogurt instead of individual-sized containers. Choose water instead of sodas and other sweetened beverages. Avoid buying chips, cookies, and other "junk food." These items are usually expensive and not healthy. Cooking Make extra food and freeze the extras in meal-sized containers or in individual portions for fast meals and snacks. Pre-cook on days when you have extra time to prepare meals in advance. You can keep these meals in the fridge or freezer and reheat for a quick meal. When you come home from the grocery store, wash, peel, and cut fruits and vegetables so they are ready to use and eat. This will help reduce food waste. Meal planning Do not eat out or get fast food. Prepare food at home. Make a grocery list and make sure to bring it with you to the store. If you have a smart phone, you could use your phone to create your shopping list. Plan meals and snacks according to a grocery list and budget you create. Use leftovers in your meal plan for the week. Look for recipes where you can cook once and make enough food for two meals. Prepare budget-friendly types of meals like stews, casseroles, and stir-fry dishes. Try some meatless meals or try "no cook" meals like salads. Make sure that half your plate is filled with fruits or vegetables. Choose  from fresh, frozen, or canned fruits and vegetables. If eating canned, remember to rinse them before eating. This will remove any excess salt added for packaging. Summary Eating healthy on a budget is possible if you plan your meals according to your budget, purchase according to your budget and grocery list, and prepare food yourself. Tips for buying more food on a limited budget include buying generic brands, using coupons only for foods you normally buy, and buying healthy items from the bulk bins when available. Tips for buying cheaper food to replace expensive food include choosing cheaper, lean cuts of meat, and buying dried beans and peas. This information is not intended to replace advice given to you by your health care provider. Make sure you discuss any questions you have with your health care provider. Document Revised: 10/19/2019 Document Reviewed: 10/19/2019 Elsevier Patient Education  2022 Elsevier Inc. Bone Health Bones protect organs, store calcium, anchor muscles, and support the whole body. Keeping your bones strong is important, especially as you get older. You can take actions to help keep your bones strong and healthy. Why is keeping my bones healthy important? Keeping your  bones healthy is important because your body constantly replaces bone cells. Cells get old, and new cells take their place. As we age, we lose bone cells because the body may not be able to make enough new cells to replace the old cells. The amount of bone cells and bone tissue you have is referred to as bone mass. The higher your bone mass, the stronger your bones. The aging process leads to an overall loss of bone mass in the body, which can increase the likelihood of: Joint pain and stiffness. Broken bones. A condition in which the bones become weak and brittle (osteoporosis). A large decline in bone mass occurs in older adults. In women, it occurs about the time of menopause. What actions can I take to  keep my bones healthy? Good health habits are important for maintaining healthy bones. This includes eating nutritious foods and exercising regularly. To have healthy bones, you need to get enough of the right minerals and vitamins. Most nutrition experts recommend getting these nutrients from the foods that you eat. In some cases, taking supplements may also be recommended. Doing certain types of exercise is also important for bone health. What are the nutritional recommendations for healthy bones? Eating a well-balanced diet with plenty of calcium and vitamin D will help to protect your bones. Nutritional recommendations vary from person to person. Ask your health care provider what is healthy for you. Here are some general guidelines. Get enough calcium Calcium is the most important (essential) mineral for bone health. Most people can get enough calcium from their diet, but supplements may be recommended for people who are at risk for osteoporosis. Good sources of calcium include: Dairy products, such as low-fat or nonfat milk, cheese, and yogurt. Dark green leafy vegetables, such as bok choy and broccoli. Calcium-fortified foods, such as orange juice, cereal, bread, soy beverages, and tofu products. Nuts, such as almonds. Follow these recommended amounts for daily calcium intake: Children, age 55-3: 700 mg. Children, age 40-8: 1,000 mg. Children, age 309-13: 1,300 mg. Teens, age 550914-18: 1,300 mg. Adults, age 54-50: 1,000 mg. Adults, age 54-70: Men: 1,000 mg. Women: 1,200 mg. Adults, age 54 or older: 1,200 mg. Pregnant and breastfeeding females: Teens: 1,300 mg. Adults: 1,000 mg. Get enough vitamin D Vitamin D is the most essential vitamin for bone health. It helps the body absorb calcium. Sunlight stimulates the skin to make vitamin D, so be sure to get enough sunlight. If you live in a cold climate or you do not get outside often, your health care provider may recommend that you take vitamin  D supplements. Good sources of vitamin D in your diet include: Egg yolks. Saltwater fish. Milk and cereal fortified with vitamin D. Follow these recommended amounts for daily vitamin D intake: Children and teens, age 55-18: 600 international units. Adults, age 54 or younger: 400-800 international units. Adults, age 40851 or older: 800-1,000 international units. Get other important nutrients Other nutrients that are important for bone health include: Phosphorus. This mineral is found in meat, poultry, dairy foods, nuts, and legumes. The recommended daily intake for adult men and adult women is 700 mg. Magnesium. This mineral is found in seeds, nuts, dark green vegetables, and legumes. The recommended daily intake for adult men is 400-420 mg. For adult women, it is 310-320 mg. Vitamin K. This vitamin is found in green leafy vegetables. The recommended daily intake is 120 mg for adult men and 90 mg for adult women. What type of physical activity  is best for building and maintaining healthy bones? Weight-bearing and strength-building activities are important for building and maintaining healthy bones. Weight-bearing activities cause muscles and bones to work against gravity. Strength-building activities increase the strength of the muscles that support bones. Weight-bearing and muscle-building activities include: Walking and hiking. Jogging and running. Dancing. Gym exercises. Lifting weights. Tennis and racquetball. Climbing stairs. Aerobics. Adults should get at least 30 minutes of moderate physical activity on most days. Children should get at least 60 minutes of moderate physical activity on most days. Ask your health care provider what type of exercise is best for you. How can I find out if my bone mass is low? Bone mass can be measured with an X-ray test called a bone mineral density (BMD) test. This test is recommended for all women who are age 35 or older. It may also be recommended  for: Men who are age 62 or older. People who are at risk for osteoporosis because of: Having bones that break easily. Having a long-term disease that weakens bones, such as kidney disease or rheumatoid arthritis. Having menopause earlier than normal. Taking medicine that weakens bones, such as steroids, thyroid hormones, or hormone treatment for breast cancer or prostate cancer. Smoking. Drinking three or more alcoholic drinks a day. If you find that you have a low bone mass, you may be able to prevent osteoporosis or further bone loss by changing your diet and lifestyle. Where can I find more information? For more information, check out the following websites: National Osteoporosis Foundation: https://carlson-fletcher.info/ [employer]: www.bones.http://www.myers.net/ International Osteoporosis Foundation: Investment banker, operational.iofbonehealth.org Summary The aging process leads to an overall loss of bone mass in the body, which can increase the likelihood of broken bones and osteoporosis. Eating a well-balanced diet with plenty of calcium and vitamin D will help to protect your bones. Weight-bearing and strength-building activities are also important for building and maintaining strong bones. Bone mass can be measured with an X-ray test called a bone mineral density (BMD) test. This information is not intended to replace advice given to you by your health care provider. Make sure you discuss any questions you have with your health care provider. Document Revised: 02/01/2017 Document Reviewed: 02/01/2017 Elsevier Patient Education  2022 Elsevier Inc.   Managing the Challenge of Quitting Smoking Quitting smoking is a physical and mental challenge. You will face cravings, withdrawal symptoms, and temptation. Before quitting, work with your health care provider to make a plan that can help you manage quitting. Preparation can help you quit and keep you from giving in. How to manage lifestyle changes Managing  stress Stress can make you want to smoke, and wanting to smoke may cause stress. It is important to find ways to manage your stress. You might try some of the following: Practice relaxation techniques. Breathe slowly and deeply, in through your nose and out through your mouth. Listen to music. Soak in a bath or take a shower. Imagine a peaceful place or vacation. Get some support. Talk with family or friends about your stress. Join a support group. Talk with a counselor or therapist. Get some physical activity. Go for a walk, run, or bike ride. Play a favorite sport. Practice yoga.  Medicines Talk with your health care provider about medicines that might help you deal with cravings and make quitting easier for you. Relationships Social situations can be difficult when you are quitting smoking. To manage this, you can: Avoid parties and other social situations where people might be smoking.  Avoid alcohol. Leave right away if you have the urge to smoke. Explain to your family and friends that you are quitting smoking. Ask for support and let them know you might be a bit grumpy. Plan activities where smoking is not an option. General instructions Be aware that many people gain weight after they quit smoking. However, not everyone does. To keep from gaining weight, have a plan in place before you quit and stick to the plan after you quit. Your plan should include: Having healthy snacks. When you have a craving, it may help to: Eat popcorn, carrots, celery, or other cut vegetables. Chew sugar-free gum. Changing how you eat. Eat small portion sizes at meals. Eat 4-6 small meals throughout the day instead of 1-2 large meals a day. Be mindful when you eat. Do not watch television or do other things that might distract you as you eat. Exercising regularly. Make time to exercise each day. If you do not have time for a long workout, do short bouts of exercise for 5-10 minutes several times a  day. Do some form of strengthening exercise, such as weight lifting. Do some exercise that gets your heart beating and causes you to breathe deeply, such as walking fast, running, swimming, or biking. This is very important. Drinking plenty of water or other low-calorie or no-calorie drinks. Drink 6-8 glasses of water daily.  How to recognize withdrawal symptoms Your body and mind may experience discomfort as you try to get used to not having nicotine in your system. These effects are called withdrawal symptoms. They may include: Feeling hungrier than normal. Having trouble concentrating. Feeling irritable or restless. Having trouble sleeping. Feeling depressed. Craving a cigarette. To manage withdrawal symptoms: Avoid places, people, and activities that trigger your cravings. Remember why you want to quit. Get plenty of sleep. Avoid coffee and other caffeinated drinks. These may worsen some of your symptoms. These symptoms may surprise you. But be assured that they are normal to have when quitting smoking. How to manage cravings Come up with a plan for how to deal with your cravings. The plan should include the following: A definition of the specific situation you want to deal with. An alternative action you will take. A clear idea for how this action will help. The name of someone who might help you with this. Cravings usually last for 5-10 minutes. Consider taking the following actions to help you with your plan to deal with cravings: Keep your mouth busy. Chew sugar-free gum. Suck on hard candies or a straw. Brush your teeth. Keep your hands and body busy. Change to a different activity right away. Squeeze or play with a ball. Do an activity or a hobby, such as making bead jewelry, practicing needlepoint, or working with wood. Mix up your normal routine. Take a short exercise break. Go for a quick walk or run up and down stairs. Focus on doing something kind or helpful for  someone else. Call a friend or family member to talk during a craving. Join a support group. Contact a quitline. Where to find support To get help or find a support group: Call the National Cancer Institute's Smoking Quitline: 1-800-QUIT NOW 785-399-6725) Visit the website of the Substance Abuse and Mental Health Services Administration: SkateOasis.com.pt Text QUIT to SmokefreeTXT: 998338 Where to find more information Visit these websites to find more information on quitting smoking: National Cancer Institute: www.smokefree.gov American Lung Association: www.lung.org American Cancer Society: www.cancer.org Centers for Disease Control and Prevention: FootballExhibition.com.br American  Heart Association: www.heart.org Contact a health care provider if: You want to change your plan for quitting. The medicines you are taking are not helping. Your eating feels out of control or you cannot sleep. Get help right away if: You feel depressed or become very anxious. Summary Quitting smoking is a physical and mental challenge. You will face cravings, withdrawal symptoms, and temptation to smoke again. Preparation can help you as you go through these challenges. Try different techniques to manage stress, handle social situations, and prevent weight gain. You can deal with cravings by keeping your mouth busy (such as by chewing gum), keeping your hands and body busy, calling family or friends, or contacting a quitline for people who want to quit smoking. You can deal with withdrawal symptoms by avoiding places where people smoke, getting plenty of rest, and avoiding drinks with caffeine. This information is not intended to replace advice given to you by your health care provider. Make sure you discuss any questions you have with your health care provider. Document Revised: 10/25/2018 Document Reviewed: 10/25/2018 Elsevier Patient Education  2022 [employer].

## 2020-09-20 LAB — CBC WITH DIFFERENTIAL/PLATELET
Absolute Monocytes: 668 cells/uL (ref 200–950)
Basophils Absolute: 38 cells/uL (ref 0–200)
Basophils Relative: 0.5 %
Eosinophils Absolute: 98 cells/uL (ref 15–500)
Eosinophils Relative: 1.3 %
HCT: 42.3 % (ref 35.0–45.0)
Hemoglobin: 14.2 g/dL (ref 11.7–15.5)
Lymphs Abs: 1943 cells/uL (ref 850–3900)
MCH: 26.3 pg — ABNORMAL LOW (ref 27.0–33.0)
MCHC: 33.6 g/dL (ref 32.0–36.0)
MCV: 78.3 fL — ABNORMAL LOW (ref 80.0–100.0)
MPV: 9.9 fL (ref 7.5–12.5)
Monocytes Relative: 8.9 %
Neutro Abs: 4755 cells/uL (ref 1500–7800)
Neutrophils Relative %: 63.4 %
Platelets: 270 10*3/uL (ref 140–400)
RBC: 5.4 10*6/uL — ABNORMAL HIGH (ref 3.80–5.10)
RDW: 15.2 % — ABNORMAL HIGH (ref 11.0–15.0)
Total Lymphocyte: 25.9 %
WBC: 7.5 10*3/uL (ref 3.8–10.8)

## 2020-09-20 LAB — COMPLETE METABOLIC PANEL WITH GFR
AG Ratio: 1.4 (calc) (ref 1.0–2.5)
ALT: 9 U/L (ref 6–29)
AST: 12 U/L (ref 10–35)
Albumin: 4.4 g/dL (ref 3.6–5.1)
Alkaline phosphatase (APISO): 81 U/L (ref 37–153)
BUN/Creatinine Ratio: 19 (calc) (ref 6–22)
BUN: 9 mg/dL (ref 7–25)
CO2: 25 mmol/L (ref 20–32)
Calcium: 9.4 mg/dL (ref 8.6–10.4)
Chloride: 105 mmol/L (ref 98–110)
Creat: 0.47 mg/dL — ABNORMAL LOW (ref 0.50–1.03)
Globulin: 3.1 g/dL (calc) (ref 1.9–3.7)
Glucose, Bld: 81 mg/dL (ref 65–99)
Potassium: 3.9 mmol/L (ref 3.5–5.3)
Sodium: 140 mmol/L (ref 135–146)
Total Bilirubin: 0.4 mg/dL (ref 0.2–1.2)
Total Protein: 7.5 g/dL (ref 6.1–8.1)
eGFR: 114 mL/min/{1.73_m2} (ref >=60)

## 2020-09-20 LAB — TSH: TSH: 1.11 mIU/L

## 2020-09-20 LAB — HEMOGLOBIN A1C
Hgb A1c MFr Bld: 5.2 % of total Hgb (ref <5.7)
Mean Plasma Glucose: 103 mg/dL
eAG (mmol/L): 5.7 mmol/L

## 2020-09-20 LAB — LIPID PANEL
Cholesterol: 208 mg/dL — ABNORMAL HIGH (ref <200)
HDL: 71 mg/dL (ref >=50)
LDL Cholesterol (Calc): 118 mg/dL (calc) — ABNORMAL HIGH
Non-HDL Cholesterol (Calc): 137 mg/dL (calc) — ABNORMAL HIGH (ref <130)
Total CHOL/HDL Ratio: 2.9 (calc) (ref <5.0)
Triglycerides: 89 mg/dL (ref <150)

## 2020-09-20 LAB — CYTOLOGY - PAP
Chlamydia: NEGATIVE
Comment: NEGATIVE
Comment: NEGATIVE
Comment: NEGATIVE
Comment: NORMAL
Diagnosis: NEGATIVE
High risk HPV: NEGATIVE
Neisseria Gonorrhea: NEGATIVE
Trichomonas: NEGATIVE

## 2020-10-25 ENCOUNTER — Ambulatory Visit: Payer: BC Managed Care – PPO | Admitting: Nurse Practitioner

## 2020-11-11 ENCOUNTER — Encounter: Payer: Self-pay | Admitting: *Deleted

## 2020-12-09 ENCOUNTER — Encounter: Payer: Self-pay | Admitting: Nurse Practitioner

## 2020-12-09 ENCOUNTER — Other Ambulatory Visit: Payer: Self-pay

## 2020-12-09 ENCOUNTER — Ambulatory Visit (INDEPENDENT_AMBULATORY_CARE_PROVIDER_SITE_OTHER): Payer: BC Managed Care – PPO | Admitting: Nurse Practitioner

## 2020-12-09 VITALS — BP 126/74 | HR 99 | Temp 98.2°F | Resp 18 | Ht 67.0 in | Wt 261.1 lb

## 2020-12-09 DIAGNOSIS — J4520 Mild intermittent asthma, uncomplicated: Secondary | ICD-10-CM | POA: Diagnosis not present

## 2020-12-09 DIAGNOSIS — R052 Subacute cough: Secondary | ICD-10-CM

## 2020-12-09 DIAGNOSIS — H9313 Tinnitus, bilateral: Secondary | ICD-10-CM | POA: Diagnosis not present

## 2020-12-09 DIAGNOSIS — K047 Periapical abscess without sinus: Secondary | ICD-10-CM

## 2020-12-09 NOTE — Progress Notes (Signed)
BP 126/74   Pulse 99   Temp 98.2 F (36.8 C) (Oral)   Resp 18   Ht '5\' 7"'  (1.702 m)   Wt 261 lb 1.6 oz (118.4 kg)   SpO2 98%   BMI 40.89 kg/m    Subjective:    Patient ID: Sheila Sanders, female    DOB: 23-Feb-1965, 55 y.o.   MRN: [id number]  HPI: Sheila Sanders is a 55 y.o. female, here alone  Chief Complaint  Patient presents with   Follow-up   Tinnitus of both ears: She was seen on 09/19/20 and complained of constant buzzing sound in her head.  She said she had noticed a few months ago.  She denies any pain or trauma.  Tried loratadine for bilateral ear effusion.  She said it did not get better.  Will refer to ENT.  Asthma/cough:  She says she has had a cough for several weeks now.  She says she does not use her inhaler.  She said she recently found mold in her bathroom. She says she threw away the moldy items and is starting to feel better.  She is wheezing today.  She denies any shortness of breath or fever.  Discussed using her inhaler when she gets home.  Dental infection: She was seen for right bottom dental pain on 09/19/20.  She was placed on antibiotics and told to see dentist.  She says she finished the antibiotics and then was able to get in with the dentist and had tooth removed.  She says she is feeling much better now.   Relevant past medical, surgical, family and social history reviewed and updated as indicated. Interim medical history since our last visit reviewed. Allergies and medications reviewed and updated.  Review of Systems  Constitutional: Negative for fever or weight change.  HEENT: positive for tinnitus  Respiratory: positive for cough and wheezing, negative for shortness of breath.   Cardiovascular: Negative for chest pain or palpitations.  Gastrointestinal: Negative for abdominal pain, no bowel changes.  Musculoskeletal: Negative for gait problem or joint swelling.  Skin: Negative for rash.  Neurological: Negative for dizziness or headache.   No other specific complaints in a complete review of systems (except as listed in HPI above).      Objective:    BP 126/74   Pulse 99   Temp 98.2 F (36.8 C) (Oral)   Resp 18   Ht '5\' 7"'  (1.702 m)   Wt 261 lb 1.6 oz (118.4 kg)   SpO2 98%   BMI 40.89 kg/m   Wt Readings from Last 3 Encounters:  12/09/20 261 lb 1.6 oz (118.4 kg)  09/19/20 258 lb 12.8 oz (117.4 kg)  09/19/20 260 lb 12.8 oz (118.3 kg)    Physical Exam  Constitutional: Patient appears well-developed and well-nourished. Obese No distress.  HEENT: head atraumatic, normocephalic, pupils equal and reactive to light, ears tms clear, neck supple, throat within normal limits Cardiovascular: Normal rate, regular rhythm and normal heart sounds.  No murmur heard. No BLE edema. Pulmonary/Chest: Effort normal and breath sounds normal. No respiratory distress. Abdominal: Soft.  There is no tenderness. Psychiatric: Patient has a normal mood and affect. behavior is normal. Judgment and thought content normal.   Results for orders placed or performed in visit on 09/19/20  COMPLETE METABOLIC PANEL WITH GFR  Result Value Ref Range   Glucose, Bld 81 65 - 99 mg/dL   BUN 9 7 - 25 mg/dL   Creat 0.47 (L) 0.50 - 1.03  mg/dL   eGFR 114 > OR = 60 mL/min/1.26m   BUN/Creatinine Ratio 19 6 - 22 (calc)   Sodium 140 135 - 146 mmol/L   Potassium 3.9 3.5 - 5.3 mmol/L   Chloride 105 98 - 110 mmol/L   CO2 25 20 - 32 mmol/L   Calcium 9.4 8.6 - 10.4 mg/dL   Total Protein 7.5 6.1 - 8.1 g/dL   Albumin 4.4 3.6 - 5.1 g/dL   Globulin 3.1 1.9 - 3.7 g/dL (calc)   AG Ratio 1.4 1.0 - 2.5 (calc)   Total Bilirubin 0.4 0.2 - 1.2 mg/dL   Alkaline phosphatase (APISO) 81 37 - 153 U/L   AST 12 10 - 35 U/L   ALT 9 6 - 29 U/L  Lipid panel  Result Value Ref Range   Cholesterol 208 (H) <200 mg/dL   HDL 71 > OR = 50 mg/dL   Triglycerides 89 <150 mg/dL   LDL Cholesterol (Calc) 118 (H) mg/dL (calc)   Total CHOL/HDL Ratio 2.9 <5.0 (calc)   Non-HDL  Cholesterol (Calc) 137 (H) <130 mg/dL (calc)  TSH  Result Value Ref Range   TSH 1.11 mIU/L  Hemoglobin A1c  Result Value Ref Range   Hgb A1c MFr Bld 5.2 <5.7 % of total Hgb   Mean Plasma Glucose 103 mg/dL   eAG (mmol/L) 5.7 mmol/L  CBC with Differential/Platelet  Result Value Ref Range   WBC 7.5 3.8 - 10.8 Thousand/uL   RBC 5.40 (H) 3.80 - 5.10 Million/uL   Hemoglobin 14.2 11.7 - 15.5 g/dL   HCT 42.3 35.0 - 45.0 %   MCV 78.3 (L) 80.0 - 100.0 fL   MCH 26.3 (L) 27.0 - 33.0 pg   MCHC 33.6 32.0 - 36.0 g/dL   RDW 15.2 (H) 11.0 - 15.0 %   Platelets 270 140 - 400 Thousand/uL   MPV 9.9 7.5 - 12.5 fL   Neutro Abs 4,755 1,500 - 7,800 cells/uL   Lymphs Abs 1,943 850 - 3,900 cells/uL   Absolute Monocytes 668 200 - 950 cells/uL   Eosinophils Absolute 98 15 - 500 cells/uL   Basophils Absolute 38 0 - 200 cells/uL   Neutrophils Relative % 63.4 %   Total Lymphocyte 25.9 %   Monocytes Relative 8.9 %   Eosinophils Relative 1.3 %   Basophils Relative 0.5 %      Assessment & Plan:   1. Tinnitus of both ears  - Ambulatory referral to ENT  2. Mild intermittent asthma, unspecified whether complicated -use inhaler prn  3. Subacute cough -use inhaler prn  4. Dental infection -all cleared up  Follow up plan: Return in about 6 months (around 06/08/2021) for follow up.

## 2021-02-05 ENCOUNTER — Ambulatory Visit (INDEPENDENT_AMBULATORY_CARE_PROVIDER_SITE_OTHER): Payer: BC Managed Care – PPO | Admitting: Dermatology

## 2021-02-05 ENCOUNTER — Other Ambulatory Visit: Payer: Self-pay

## 2021-02-05 DIAGNOSIS — L219 Seborrheic dermatitis, unspecified: Secondary | ICD-10-CM

## 2021-02-05 DIAGNOSIS — L858 Other specified epidermal thickening: Secondary | ICD-10-CM

## 2021-02-05 MED ORDER — HYDROCORTISONE 2.5 % EX CREA: TOPICAL_CREAM | Freq: Two times a day (BID) | CUTANEOUS | 0 refills | Status: DC | PRN

## 2021-02-05 MED ORDER — FLUOCINOLONE ACETONIDE BODY 0.01 % EX OIL: 1.0000 "application " | TOPICAL_OIL | Freq: Every day | CUTANEOUS | 4 refills | Status: AC

## 2021-02-05 NOTE — Patient Instructions (Addendum)
Use every 2 weeks  Recommend Avlon Keracare anti-dandruff shampoo and conditioner at least 2 x weekly, leaving shampoo on for about 10 minutes before rinsing and conditioning.  For scars  Recommend Serica moisturizing scar formula cream every night or [facility] brand or Mederma silicone scar sheet every night for the first year after a scar appears to help with scar remodeling if desired. Scars remodel on their own for a full year.   For arms and legs for keratosis pilaris  can use an emollient (moisturizer) containing ammonium lactate, urea or salicylic acid once a day to smooth the area  Recommend OTC [place] Rapid Relief Anti-Itch cream (pramoxine + menthol), CeraVe Anti-itch cream or lotion (pramoxine), Sarna lotion (Original- menthol + camphor or Sensitive- pramoxine) or Eucerin 12 hour Itch Relief lotion (menthol) up to 3 times per day to areas on body that are itchy.   Gentle Skin Care Guide  1. Bathe no more than once a day.  2. Avoid bathing in hot water  3. Use a mild soap like Dove, Vanicream, Cetaphil, CeraVe. Can use Lever 2000 or Cetaphil antibacterial soap  4. Use soap only where you need it. On most days, use it under your arms, between your legs, and on your feet. Let the water rinse other areas unless visibly dirty.  5. When you get out of the bath/shower, use a towel to gently blot your skin dry, don't rub it.  6. While your skin is still a little damp, apply a moisturizing cream such as Vanicream, CeraVe, Cetaphil, Eucerin, Sarna lotion or plain Vaseline Jelly. For hands apply Neutrogena [place] Hand Cream or Excipial Hand Cream.  7. Reapply moisturizer any time you start to itch or feel dry.  8. Sometimes using free and clear laundry detergents can be helpful. Fabric softener sheets should be avoided. Downy Free & Gentle liquid, or any liquid fabric softener that is free of dyes and perfumes, it acceptable to use  9. If your doctor has given you prescription  creams you may apply moisturizers over them    If You Need Anything After Your Visit  If you have any questions or concerns for your doctor, please call our main line at [phone] and press option 4 to reach your doctor's medical assistant. If no one answers, please leave a voicemail as directed and we will return your call as soon as possible. Messages left after 4 pm will be answered the following business day.   You may also send [place] a message via MyChart. We typically respond to MyChart messages within 1-2 business days.  For prescription refills, please ask your pharmacy to contact our office. Our fax number is [phone].  If you have an urgent issue when the clinic is closed that cannot wait until the next business day, you can page your doctor at the number below.    Please note that while we do our best to be available for urgent issues outside of office hours, we are not available 24/7.   If you have an urgent issue and are unable to reach [place], you may choose to seek medical care at your doctor's office, retail clinic, urgent care center, or emergency room.  If you have a medical emergency, please immediately call 911 or go to the emergency department.  Pager Numbers  - Dr. Gwen Pounds: [phone]  - Dr. Neale Burly: [phone]  - Dr. Roseanne Reno: [phone]  In the event of inclement weather, please call our main line at [phone] for an update  on the status of any delays or closures.  Dermatology Medication Tips: Please keep the boxes that topical medications come in in order to help keep track of the instructions about where and how to use these. Pharmacies typically print the medication instructions only on the boxes and not directly on the medication tubes.   If your medication is too expensive, please contact our office at [phone] option 4 or send [place] a message through MyChart.   We are unable to tell what your co-pay for medications will be in advance as this is  different depending on your insurance coverage. However, we may be able to find a substitute medication at lower cost or fill out paperwork to get insurance to cover a needed medication.   If a prior authorization is required to get your medication covered by your insurance company, please allow [place] 1-2 business days to complete this process.  Drug prices often vary depending on where the prescription is filled and some pharmacies may offer cheaper prices.  The website www.goodrx.com contains coupons for medications through different pharmacies. The prices here do not account for what the cost may be with help from insurance (it may be cheaper with your insurance), but the website can give you the price if you did not use any insurance.  - You can print the associated coupon and take it with your prescription to the pharmacy.  - You may also stop by our office during regular business hours and pick up a GoodRx coupon card.  - If you need your prescription sent electronically to a different pharmacy, notify our office through [facility] or by phone at [phone] option 4.     Si Usted Necesita Algo Despus de Su Visita  Tambin puede enviarnos un mensaje a travs de Clinical cytogeneticist. Por lo general respondemos a los mensajes de MyChart en el transcurso de 1 a 2 das hbiles.  Para renovar recetas, por favor pida a su farmacia que se ponga en contacto con nuestra oficina. Annie Sable de fax es [place] [phone].  Si tiene un asunto urgente cuando la clnica est cerrada y que no puede esperar hasta el siguiente da hbil, puede llamar/localizar a su doctor(a) al nmero que aparece a continuacin.   Por favor, tenga en cuenta que aunque hacemos todo lo posible para estar disponibles para asuntos urgentes fuera del horario de [place], no estamos disponibles las 24 horas del da, los 7 [address]  [address] de la [place].   Si tiene un problema urgente y no puede comunicarse con nosotros, puede optar por buscar  atencin mdica  en el consultorio de su doctor(a), en una clnica privada, en un centro de atencin urgente o en una sala de emergencias.  Si tiene Engineer, drilling, por favor llame inmediatamente al 911 o vaya a la sala de emergencias.  Nmeros de bper  - Dr. Gwen Pounds: [phone]  - Dra. Moye: [phone]  - Dra. Roseanne Reno: [phone]  En caso de inclemencias del [place], por favor llame a Lacy Duverney principal al [phone] para una actualizacin sobre el [place] de cualquier retraso o cierre.  Consejos para la medicacin en dermatologa: Por favor, guarde las cajas en las que vienen los medicamentos de uso tpico para ayudarle a seguir las instrucciones sobre dnde y cmo usarlos. Las farmacias generalmente imprimen las instrucciones del medicamento slo en las cajas y no directamente en los tubos del [place].   Si su medicamento es muy caro, por favor, pngase en contacto con nuestra oficina llamando al  [phone] y presione la opcin 4 o envenos un mensaje a travs de Clinical cytogeneticistMyChart.   No podemos decirle cul ser su copago por los medicamentos por adelantado ya que esto es diferente dependiendo de la cobertura de su seguro. Sin embargo, es posible que podamos encontrar un medicamento sustituto a Audiological scientistmenor costo o llenar un formulario para que el seguro cubra el medicamento que se considera necesario.   Si se requiere una autorizacin previa para que su compaa de seguros [place] su medicamento, por favor permtanos de 1 a 2 das hbiles para completar [address].  Los precios de los medicamentos varan con frecuencia dependiendo del Environmental consultantlugar de dnde se surte la receta y alguna farmacias pueden ofrecer precios ms baratos.  El sitio web www.goodrx.com tiene cupones para medicamentos de Health and safety inspectordiferentes farmacias. Los precios aqu no tienen en cuenta lo que podra costar con la ayuda del seguro (puede ser ms barato con su seguro), pero el sitio web puede darle el precio si no utiliz  Tourist information centre managerningn seguro.  - Puede imprimir el cupn correspondiente y llevarlo con su receta a la farmacia.  - Tambin puede pasar por nuestra oficina durante el horario de atencin regular y Education officer, museumrecoger una tarjeta de cupones de GoodRx.  - Si necesita que su receta se enve electrnicamente a una farmacia diferente, informe a nuestra oficina a travs de MyChart de [place] o por telfono llamando al [phone] y presione la opcin 4.

## 2021-02-05 NOTE — Progress Notes (Signed)
° °  New Patient Visit  Subjective  Sheila Sanders is a 56 y.o. female who presents for the following: New Patient (Initial Visit) (Patient here today concerning dry itchy skin all over body and at forehead. Patient has tried coconut oil and oatmeal soap. Patient also reports she has very itchy scalp. Patient has tried over the counter treatment and nothing helps. ).  The following portions of the chart were reviewed this encounter and updated as appropriate:   Tobacco   Allergies   Meds   Problems   Med Hx   Surg Hx   Fam Hx       Review of Systems:  No other skin or systemic complaints except as noted in HPI or Assessment and Plan.  Objective  Well appearing patient in no apparent distress; mood and affect are within normal limits.  A focused examination was performed including face, scalp, legs and arms. Relevant physical exam findings are noted in the Assessment and Plan.  Scalp Scale at scalp and forehead     Assessment & Plan  Seborrheic dermatitis Scalp  Chronic condition with duration or expected duration over one year. Condition is bothersome to patient. Currently flared.  Seborrheic Dermatitis  -  is a chronic persistent rash characterized by pinkness and scaling most commonly of the mid face but also can occur on the scalp (dandruff), ears; mid chest, mid back and groin.  It tends to be exacerbated by stress and cooler weather.  People who have neurologic disease may experience new onset or exacerbation of existing seborrheic dermatitis.  The condition is not curable but treatable and can be controlled.  Recommend Avlon Keracare anti-dandruff shampoo and conditioner at least once weekly, leaving shampoo on for about 10 minutes before rinsing and conditioning.   Start fluocinolone acetonide oil daily prn to scalp. Avoid applying to face, groin, and axilla. Use as directed. Long-term use can cause thinning of the skin.  Start hydrocortisone 2.5 cream apply twice daily at  face for up to 2 weeks. If she needs this often or this does not clear rash within 2 weeks, she will call and will send ketoconazole cream for maintenance.  Topical steroids (such as triamcinolone, fluocinolone, fluocinonide, mometasone, clobetasol, halobetasol, betamethasone, hydrocortisone) can cause thinning and lightening of the skin if they are used for too long in the same area. Your physician has selected the right strength medicine for your problem and area affected on the body. Please use your medication only as directed by your physician to prevent side effects.    hydrocortisone 2.5 % cream - Scalp Apply topically 2 (two) times daily as needed (Rash). Apply to affected areas of face Use for up to 2 weeks  Related Medications Fluocinolone Acetonide Body 0.01 % OIL Apply 1 application topically daily. To scalp. Avoid face, groin, and axilla   Keratosis Pilaris - Tiny follicular keratotic papules at arms and legs  - Benign. Genetic in nature. No cure. - Observe. - If desired, patient can use an emollient (moisturizer) containing ammonium lactate, urea or salicylic acid once a day to smooth the area  Return for 4 - 6 week follow up. I, Asher Muir, CMA, am acting as scribe for Darden Dates, MD.  Documentation: I have reviewed the above documentation for accuracy and completeness, and I agree with the above.  Darden Dates, MD

## 2021-02-17 ENCOUNTER — Encounter: Payer: Self-pay | Admitting: Dermatology

## 2021-02-18 DIAGNOSIS — H9311 Tinnitus, right ear: Secondary | ICD-10-CM | POA: Diagnosis not present

## 2021-02-18 DIAGNOSIS — H9313 Tinnitus, bilateral: Secondary | ICD-10-CM | POA: Diagnosis not present

## 2021-03-21 ENCOUNTER — Other Ambulatory Visit: Payer: Self-pay | Admitting: Nurse Practitioner

## 2021-03-21 ENCOUNTER — Other Ambulatory Visit (HOSPITAL_COMMUNITY): Payer: Self-pay

## 2021-03-21 ENCOUNTER — Encounter: Payer: Self-pay | Admitting: Nurse Practitioner

## 2021-03-21 DIAGNOSIS — J4520 Mild intermittent asthma, uncomplicated: Secondary | ICD-10-CM

## 2021-03-21 MED ORDER — ALBUTEROL SULFATE HFA 108 (90 BASE) MCG/ACT IN AERS
2.0000 | INHALATION_SPRAY | Freq: Four times a day (QID) | RESPIRATORY_TRACT | 0 refills | Status: DC | PRN
  Filled 2021-03-21: qty 8.5, 30d supply, fill #0

## 2021-03-28 ENCOUNTER — Encounter: Payer: Self-pay | Admitting: Dermatology

## 2021-03-28 DIAGNOSIS — L219 Seborrheic dermatitis, unspecified: Secondary | ICD-10-CM

## 2021-03-31 MED ORDER — KETOCONAZOLE 2 % EX SHAM: MEDICATED_SHAMPOO | CUTANEOUS | 3 refills | Status: DC

## 2021-04-08 ENCOUNTER — Ambulatory Visit: Payer: BC Managed Care – PPO | Admitting: Dermatology

## 2021-04-10 ENCOUNTER — Ambulatory Visit (INDEPENDENT_AMBULATORY_CARE_PROVIDER_SITE_OTHER): Payer: BC Managed Care – PPO | Admitting: Nurse Practitioner

## 2021-04-10 ENCOUNTER — Other Ambulatory Visit: Payer: Self-pay

## 2021-04-10 ENCOUNTER — Encounter: Payer: Self-pay | Admitting: Nurse Practitioner

## 2021-04-10 VITALS — BP 122/68 | HR 100 | Temp 98.1°F | Resp 18 | Ht 67.0 in | Wt 262.2 lb

## 2021-04-10 DIAGNOSIS — Z6841 Body Mass Index (BMI) 40.0 and over, adult: Secondary | ICD-10-CM | POA: Diagnosis not present

## 2021-04-10 MED ORDER — CONTRAVE 8-90 MG PO TB12: ORAL_TABLET | ORAL | 1 refills | Status: DC

## 2021-04-10 NOTE — Progress Notes (Signed)
? ?BP 122/68   Pulse 100   Temp 98.1 ?F (36.7 ?C) (Oral)   Resp 18   Ht 5' 7" (1.702 m)   Wt 262 lb 3.2 oz (118.9 kg)   SpO2 98%   BMI 41.07 kg/m?   ? ?Subjective:  ? ? Patient ID: Sheila Sanders, female    DOB: 01/19/1966, 56 y.o.   MRN: [id number] ? ?HPI: ?Sheila Sanders is a 56 y.o. female ? ?Chief Complaint  ?Patient presents with  ? Obesity  ?  Discuss weight loss options  ? ?Obesity:  Highest weight 289 lbs, current weight 262 lbs with a BMI of 41.07.  She has tried to lose weight with Radonna Ricker, watching her diet and exercise.   Discussed weight loss medications and side effects.  She would like to try contrave.  Prescription sent in.   ? ?Relevant past medical, surgical, family and social history reviewed and updated as indicated. Interim medical history since our last visit reviewed. ?Allergies and medications reviewed and updated. ? ?Review of Systems ? ?Constitutional: Negative for fever or weight change.  ?Respiratory: Negative for cough and shortness of breath.   ?Cardiovascular: Negative for chest pain or palpitations.  ?Gastrointestinal: Negative for abdominal pain, no bowel changes.  ?Musculoskeletal: Negative for gait problem or joint swelling.  ?Skin: Negative for rash.  ?Neurological: Negative for dizziness or headache.  ?No other specific complaints in a complete review of systems (except as listed in HPI above).  ? ?   ?Objective:  ?  ?BP 122/68   Pulse 100   Temp 98.1 ?F (36.7 ?C) (Oral)   Resp 18   Ht 5' 7" (1.702 m)   Wt 262 lb 3.2 oz (118.9 kg)   SpO2 98%   BMI 41.07 kg/m?   ?Wt Readings from Last 3 Encounters:  ?04/10/21 262 lb 3.2 oz (118.9 kg)  ?12/09/20 261 lb 1.6 oz (118.4 kg)  ?09/19/20 258 lb 12.8 oz (117.4 kg)  ?  ?Physical Exam ? ?Constitutional: Patient appears well-developed and well-nourished. Obese  No distress.  ?HEENT: head atraumatic, normocephalic, pupils equal and reactive to light, neck supple ?Cardiovascular: Normal rate, regular rhythm and normal heart  sounds.  No murmur heard. No BLE edema. ?Pulmonary/Chest: Effort normal and breath sounds normal. No respiratory distress. ?Abdominal: Soft.  There is no tenderness. ?Psychiatric: Patient has a normal mood and affect. behavior is normal. Judgment and thought content normal.  ? ?Results for orders placed or performed in visit on 09/19/20  ?COMPLETE METABOLIC PANEL WITH GFR  ?Result Value Ref Range  ? Glucose, Bld 81 65 - 99 mg/dL  ? BUN 9 7 - 25 mg/dL  ? Creat 0.47 (L) 0.50 - 1.03 mg/dL  ? eGFR 114 > OR = 60 mL/min/1.36m  ? BUN/Creatinine Ratio 19 6 - 22 (calc)  ? Sodium 140 135 - 146 mmol/L  ? Potassium 3.9 3.5 - 5.3 mmol/L  ? Chloride 105 98 - 110 mmol/L  ? CO2 25 20 - 32 mmol/L  ? Calcium 9.4 8.6 - 10.4 mg/dL  ? Total Protein 7.5 6.1 - 8.1 g/dL  ? Albumin 4.4 3.6 - 5.1 g/dL  ? Globulin 3.1 1.9 - 3.7 g/dL (calc)  ? AG Ratio 1.4 1.0 - 2.5 (calc)  ? Total Bilirubin 0.4 0.2 - 1.2 mg/dL  ? Alkaline phosphatase (APISO) 81 37 - 153 U/L  ? AST 12 10 - 35 U/L  ? ALT 9 6 - 29 U/L  ?Lipid panel  ?Result Value Ref Range  ?  Cholesterol 208 (H) <200 mg/dL  ? HDL 71 > OR = 50 mg/dL  ? Triglycerides 89 <150 mg/dL  ? LDL Cholesterol (Calc) 118 (H) mg/dL (calc)  ? Total CHOL/HDL Ratio 2.9 <5.0 (calc)  ? Non-HDL Cholesterol (Calc) 137 (H) <130 mg/dL (calc)  ?TSH  ?Result Value Ref Range  ? TSH 1.11 mIU/L  ?Hemoglobin A1c  ?Result Value Ref Range  ? Hgb A1c MFr Bld 5.2 <5.7 % of total Hgb  ? Mean Plasma Glucose 103 mg/dL  ? eAG (mmol/L) 5.7 mmol/L  ?CBC with Differential/Platelet  ?Result Value Ref Range  ? WBC 7.5 3.8 - 10.8 Thousand/uL  ? RBC 5.40 (H) 3.80 - 5.10 Million/uL  ? Hemoglobin 14.2 11.7 - 15.5 g/dL  ? HCT 42.3 35.0 - 45.0 %  ? MCV 78.3 (L) 80.0 - 100.0 fL  ? MCH 26.3 (L) 27.0 - 33.0 pg  ? MCHC 33.6 32.0 - 36.0 g/dL  ? RDW 15.2 (H) 11.0 - 15.0 %  ? Platelets 270 140 - 400 Thousand/uL  ? MPV 9.9 7.5 - 12.5 fL  ? Neutro Abs 4,755 1,500 - 7,800 cells/uL  ? Lymphs Abs 1,943 850 - 3,900 cells/uL  ? Absolute Monocytes 668 200  - 950 cells/uL  ? Eosinophils Absolute 98 15 - 500 cells/uL  ? Basophils Absolute 38 0 - 200 cells/uL  ? Neutrophils Relative % 63.4 %  ? Total Lymphocyte 25.9 %  ? Monocytes Relative 8.9 %  ? Eosinophils Relative 1.3 %  ? Basophils Relative 0.5 %  ? ?   ?Assessment & Plan:  ? ?1. Class 3 severe obesity due to excess calories without serious comorbidity with body mass index (BMI) of 40.0 to 44.9 in adult [facility] ?-continue to work on exercise and eating healthy ?- Naltrexone-buPROPion HCl ER (CONTRAVE) 8-90 MG TB12; Start 1 tablet every morning for 7 days, then 1 tablet twice daily for 7 days, then 2 tablets every morning and one every evening  Dispense: 120 tablet; Refill: 1  ? ?Follow up plan: ?Return in about 4 weeks (around 05/08/2021) for follow up. ? ? ? ? ? ?

## 2021-04-15 ENCOUNTER — Other Ambulatory Visit (HOSPITAL_COMMUNITY): Payer: Self-pay

## 2021-04-28 ENCOUNTER — Other Ambulatory Visit: Payer: Self-pay

## 2021-04-28 ENCOUNTER — Telehealth: Payer: Self-pay

## 2021-04-28 DIAGNOSIS — Z1211 Encounter for screening for malignant neoplasm of colon: Secondary | ICD-10-CM

## 2021-04-28 DIAGNOSIS — Z1231 Encounter for screening mammogram for malignant neoplasm of breast: Secondary | ICD-10-CM

## 2021-04-28 NOTE — Telephone Encounter (Signed)
Spoke with patient to go over outstanding HM tasks. Patient given number for mammography as well as GI for colonoscopy. Patient stated she was not interested in completing them at this time but would keep the numbers if she changed her mind.  ?

## 2021-04-29 ENCOUNTER — Telehealth: Payer: Self-pay

## 2021-04-29 NOTE — Telephone Encounter (Signed)
Sheila Sanders spoke with patient in regards incomplete cologuard test. Pt stated she has the kit but just hasn't completed it, but will and send back to [facility].

## 2021-06-26 ENCOUNTER — Ambulatory Visit (INDEPENDENT_AMBULATORY_CARE_PROVIDER_SITE_OTHER): Payer: BC Managed Care – PPO | Admitting: Nurse Practitioner

## 2021-06-26 ENCOUNTER — Encounter: Payer: Self-pay | Admitting: Nurse Practitioner

## 2021-06-26 VITALS — BP 126/82 | HR 102 | Temp 98.0°F | Resp 16 | Ht 67.0 in | Wt 266.3 lb

## 2021-06-26 DIAGNOSIS — Z9109 Other allergy status, other than to drugs and biological substances: Secondary | ICD-10-CM | POA: Diagnosis not present

## 2021-06-26 DIAGNOSIS — J4520 Mild intermittent asthma, uncomplicated: Secondary | ICD-10-CM

## 2021-06-26 MED ORDER — QVAR REDIHALER 40 MCG/ACT IN AERB: 1.0000 | INHALATION_SPRAY | RESPIRATORY_TRACT | 3 refills | Status: DC | PRN

## 2021-06-26 MED ORDER — FLUTICASONE PROPIONATE 50 MCG/ACT NA SUSP: 2.0000 | Freq: Every day | NASAL | 6 refills | Status: AC

## 2021-06-26 MED ORDER — LORATADINE 10 MG PO TABS: 10.0000 mg | ORAL_TABLET | Freq: Every day | ORAL | 11 refills | Status: AC

## 2021-06-26 MED ORDER — QVAR REDIHALER 40 MCG/ACT IN AERB: 1.0000 | INHALATION_SPRAY | Freq: Every day | RESPIRATORY_TRACT | 3 refills | Status: DC

## 2021-06-26 NOTE — Progress Notes (Signed)
BP 126/82   Pulse (!) 102   Temp 98 F (36.7 C) (Oral)   Resp 16   Ht '5\' 7"'  (1.702 m)   Wt 266 lb 4.8 oz (120.8 kg)   SpO2 96%   BMI 41.71 kg/m    Subjective:    Patient ID: Sheila Sanders, female    DOB: [birth year], 56 y.o.   MRN: [id number]  HPI: Sheila Sanders is a 56 y.o. female  Chief Complaint  Patient presents with   Asthma    Pt requesting Ventolin for asthma, pt states albuterol makes her heart rate rises which concerns her.   Asthma: Patient reports that she prefers Ventolin opposed to albuterol.  Discussed that the same thing and I do not have a different way of ordering it.  She said her pharmacy said that we would have to fill out a special form in order for her to get the brand-name Ventolin.  She also reports she has had to use her albuterol inhaler more often.  Discussed switching to an ICS inhaler.  His insurance did not cover any other inhaler except for Qvar.  Darting patient on Qvar.  Discussed with patient to rinse out mouth and spit after use.    Allergies/tinnitus: Patient reports she saw ear nose and throat.  She says that she did not get really much of any assistance.  Discussed with patient about starting Claritin and Flonase.  Patient is in agreement with plan.  Relevant past medical, surgical, family and social history reviewed and updated as indicated. Interim medical history since our last visit reviewed. Allergies and medications reviewed and updated.  Review of Systems  Constitutional: Negative for fever or weight change.  HEENT: Positive for tinnitus in right ear Respiratory: Negative for cough and shortness of breath.  Positive for wheezing Cardiovascular: Negative for chest pain or palpitations.  Gastrointestinal: Negative for abdominal pain, no bowel changes.  Musculoskeletal: Negative for gait problem or joint swelling.  Skin: Negative for rash.  Neurological: Negative for dizziness or headache.  No other specific complaints in a  complete review of systems (except as listed in HPI above).      Objective:    BP 126/82   Pulse (!) 102   Temp 98 F (36.7 C) (Oral)   Resp 16   Ht '5\' 7"'  (1.702 m)   Wt 266 lb 4.8 oz (120.8 kg)   SpO2 96%   BMI 41.71 kg/m   Wt Readings from Last 3 Encounters:  06/26/21 266 lb 4.8 oz (120.8 kg)  04/10/21 262 lb 3.2 oz (118.9 kg)  12/09/20 261 lb 1.6 oz (118.4 kg)    Physical Exam  Constitutional: Patient appears well-developed and well-nourished. Obese No distress.  HEENT: head atraumatic, normocephalic, pupils equal and reactive to light, ears TMs clear, neck supple, throat within normal limits Cardiovascular: Normal rate, regular rhythm and normal heart sounds.  No murmur heard. No BLE edema. Pulmonary/Chest: Effort normal and breath sounds normal. No respiratory distress. Abdominal: Soft.  There is no tenderness. Psychiatric: Patient has a normal mood and affect. behavior is normal. Judgment and thought content normal.  Results for orders placed or performed in visit on 09/19/20  COMPLETE METABOLIC PANEL WITH GFR  Result Value Ref Range   Glucose, Bld 81 65 - 99 mg/dL   BUN 9 7 - 25 mg/dL   Creat 0.47 (L) 0.50 - 1.03 mg/dL   eGFR 114 > OR = 60 mL/min/1.59m   BUN/Creatinine Ratio 19 6 - 22 (  calc)   Sodium 140 135 - 146 mmol/L   Potassium 3.9 3.5 - 5.3 mmol/L   Chloride 105 98 - 110 mmol/L   CO2 25 20 - 32 mmol/L   Calcium 9.4 8.6 - 10.4 mg/dL   Total Protein 7.5 6.1 - 8.1 g/dL   Albumin 4.4 3.6 - 5.1 g/dL   Globulin 3.1 1.9 - 3.7 g/dL (calc)   AG Ratio 1.4 1.0 - 2.5 (calc)   Total Bilirubin 0.4 0.2 - 1.2 mg/dL   Alkaline phosphatase (APISO) 81 37 - 153 U/L   AST 12 10 - 35 U/L   ALT 9 6 - 29 U/L  Lipid panel  Result Value Ref Range   Cholesterol 208 (H) <200 mg/dL   HDL 71 > OR = 50 mg/dL   Triglycerides 89 <150 mg/dL   LDL Cholesterol (Calc) 118 (H) mg/dL (calc)   Total CHOL/HDL Ratio 2.9 <5.0 (calc)   Non-HDL Cholesterol (Calc) 137 (H) <130 mg/dL (calc)   TSH  Result Value Ref Range   TSH 1.11 mIU/L  Hemoglobin A1c  Result Value Ref Range   Hgb A1c MFr Bld 5.2 <5.7 % of total Hgb   Mean Plasma Glucose 103 mg/dL   eAG (mmol/L) 5.7 mmol/L  CBC with Differential/Platelet  Result Value Ref Range   WBC 7.5 3.8 - 10.8 Thousand/uL   RBC 5.40 (H) 3.80 - 5.10 Million/uL   Hemoglobin 14.2 11.7 - 15.5 g/dL   HCT 42.3 35.0 - 45.0 %   MCV 78.3 (L) 80.0 - 100.0 fL   MCH 26.3 (L) 27.0 - 33.0 pg   MCHC 33.6 32.0 - 36.0 g/dL   RDW 15.2 (H) 11.0 - 15.0 %   Platelets 270 140 - 400 Thousand/uL   MPV 9.9 7.5 - 12.5 fL   Neutro Abs 4,755 1,500 - 7,800 cells/uL   Lymphs Abs 1,943 850 - 3,900 cells/uL   Absolute Monocytes 668 200 - 950 cells/uL   Eosinophils Absolute 98 15 - 500 cells/uL   Basophils Absolute 38 0 - 200 cells/uL   Neutrophils Relative % 63.4 %   Total Lymphocyte 25.9 %   Monocytes Relative 8.9 %   Eosinophils Relative 1.3 %   Basophils Relative 0.5 %      Assessment & Plan:   Problem List Items Addressed This Visit       Respiratory   Mild intermittent asthma - Primary    Patient is going to try Qvar.  Discussed with patient that she needs to rinse her mouth after use.      Relevant Medications   beclomethasone (QVAR REDIHALER) 40 MCG/ACT inhaler     Other   Environmental allergies    Discussed with patient about starting Claritin and Flonase.  Patient is agreeable to plan.      Relevant Medications   loratadine (CLARITIN) 10 MG tablet   fluticasone (FLONASE) 50 MCG/ACT nasal spray     Follow up plan: Return if symptoms worsen or fail to improve.

## 2021-06-26 NOTE — Assessment & Plan Note (Signed)
Discussed with patient about starting Claritin and Flonase.  Patient is agreeable to plan.

## 2021-06-26 NOTE — Assessment & Plan Note (Signed)
Patient is going to try Qvar.  Discussed with patient that she needs to rinse her mouth after use.

## 2021-06-27 ENCOUNTER — Other Ambulatory Visit: Payer: Self-pay | Admitting: Nurse Practitioner

## 2021-06-27 ENCOUNTER — Encounter: Payer: Self-pay | Admitting: Nurse Practitioner

## 2021-06-27 DIAGNOSIS — J4520 Mild intermittent asthma, uncomplicated: Secondary | ICD-10-CM

## 2021-06-27 MED ORDER — ALBUTEROL SULFATE HFA 108 (90 BASE) MCG/ACT IN AERS: 2.0000 | INHALATION_SPRAY | Freq: Four times a day (QID) | RESPIRATORY_TRACT | 0 refills | Status: DC | PRN

## 2021-07-23 ENCOUNTER — Encounter: Payer: Self-pay | Admitting: Nurse Practitioner

## 2021-07-25 DIAGNOSIS — R300 Dysuria: Secondary | ICD-10-CM | POA: Diagnosis not present

## 2021-12-20 ENCOUNTER — Other Ambulatory Visit: Payer: Self-pay | Admitting: Nurse Practitioner

## 2021-12-20 DIAGNOSIS — J4520 Mild intermittent asthma, uncomplicated: Secondary | ICD-10-CM

## 2021-12-22 NOTE — Telephone Encounter (Signed)
Requested Prescriptions  Pending Prescriptions Disp Refills   VENTOLIN HFA 108 (90 Base) MCG/ACT inhaler [Pharmacy Med Name: Ventolin HFA 108 (90 Base) MCG/ACT Inhalation Aerosol Solution] 18 g 0    Sig: INHALE 2 PUFFS BY MOUTH EVERY 6 HOURS AS NEEDED FOR WHEEZING FOR SHORTNESS OF BREATH     Pulmonology:  Beta Agonists 2 Passed - 12/20/2021  2:51 PM      Passed - Last BP in normal range    BP Readings from Last 1 Encounters:  06/26/21 126/82         Passed - Last Heart Rate in normal range    Pulse Readings from Last 1 Encounters:  06/26/21 (!) 102         Passed - Valid encounter within last 12 months    Recent Outpatient Visits           5 months ago Mild intermittent asthma, unspecified whether complicated   [facility] [place], Raynelle Fanning F, FNP   8 months ago Class 3 severe obesity due to excess calories without serious comorbidity with body mass index (BMI) of 40.0 to 44.9 in adult [facility])   [facility] Berniece Salines, FNP   1 year ago Tinnitus of both ears   [facility] Berniece Salines, FNP   1 year ago Encounter for wellness examination   [facility] Berniece Salines, FNP   2 years ago Family history of colon cancer in father   [facility] Jamelle Haring, MD

## 2022-03-12 DIAGNOSIS — H1045 Other chronic allergic conjunctivitis: Secondary | ICD-10-CM | POA: Diagnosis not present

## 2022-03-25 IMAGING — CT CT RENAL STONE PROTOCOL
2 of 4 series · 17 of 46 positions shown, 19 images · non-contrast
Comparison: None.

CLINICAL DATA: Left flank pain

EXAM:
CT ABDOMEN AND PELVIS WITHOUT CONTRAST
TECHNIQUE: Multidetector CT imaging of the abdomen and pelvis was performed
following the standard protocol without IV contrast.

[Series 2: stone full standard · axial · 0.74mm/px · z∈[-836,-441]mm · 14 of 87 slices shown, 16 images]
[im 4/87  soft-tissue]
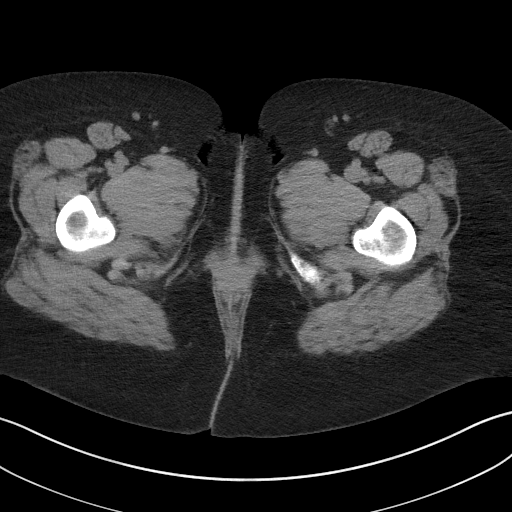
[im 4/87  bone]
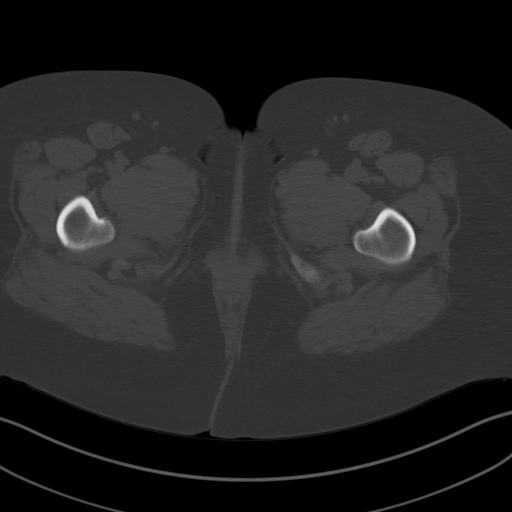
[im 11/87  soft-tissue]
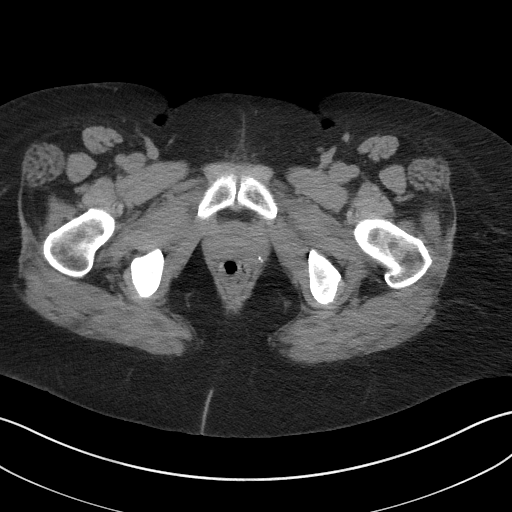
[im 18/87  soft-tissue]
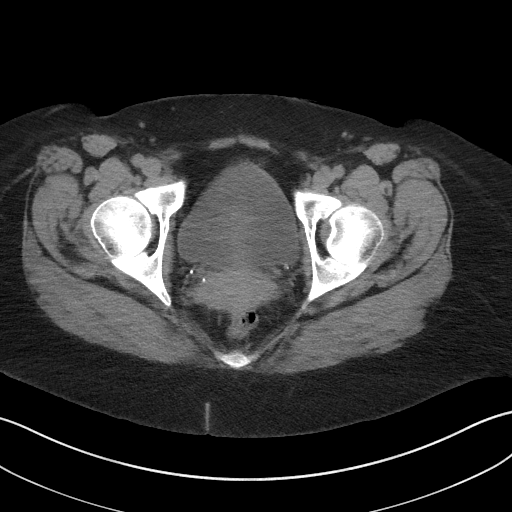
[im 25/87  soft-tissue]
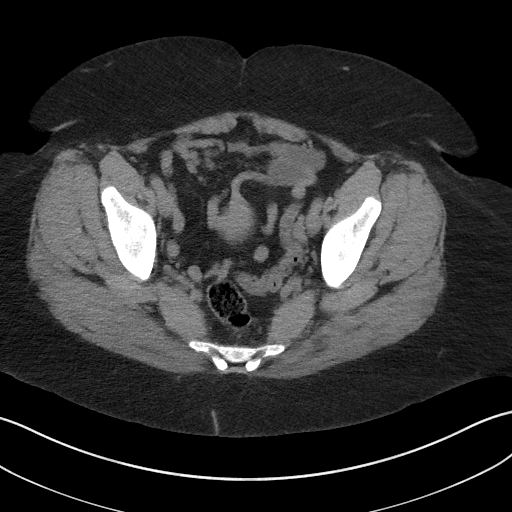
[im 28/87  soft-tissue]
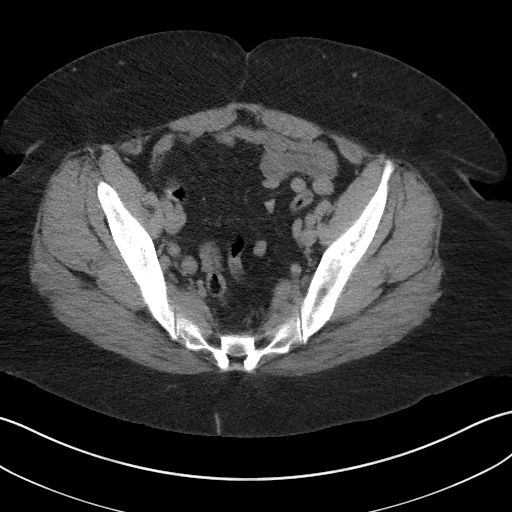
[im 35/87  soft-tissue]
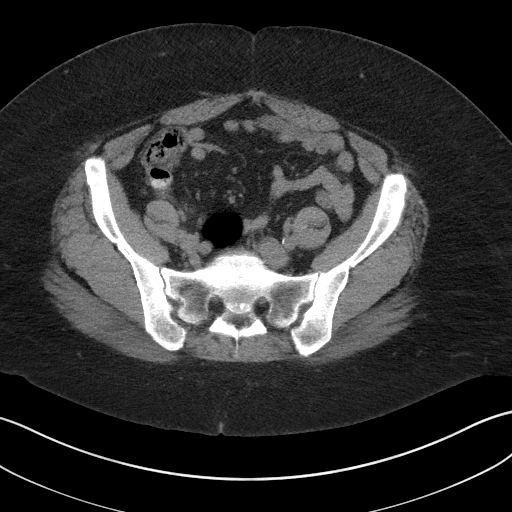
[im 42/87  soft-tissue]
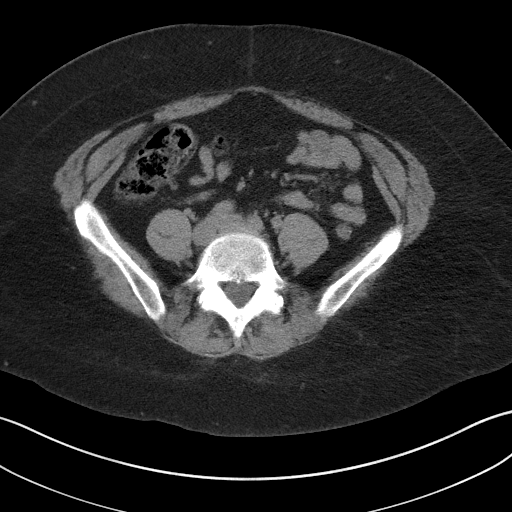
[im 45/87  soft-tissue]
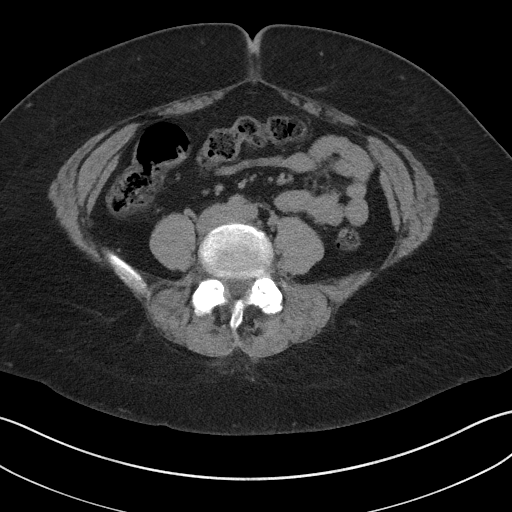
[im 52/87  soft-tissue]
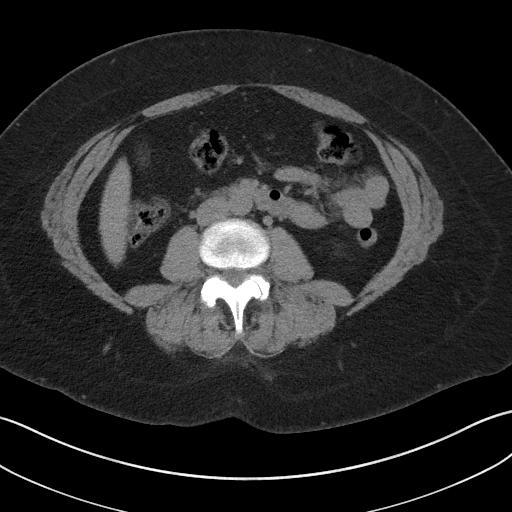
[im 52/87  bone]
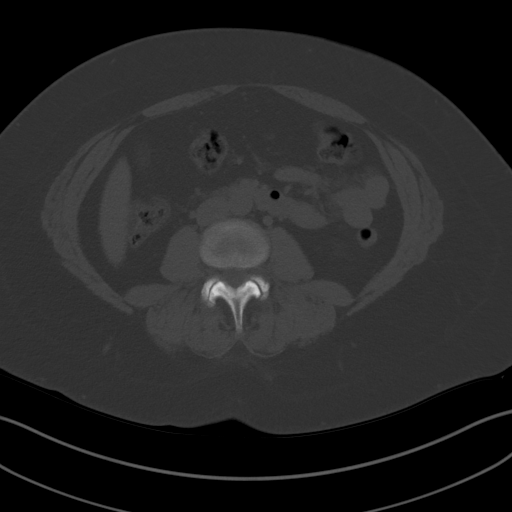
[im 59/87  soft-tissue]
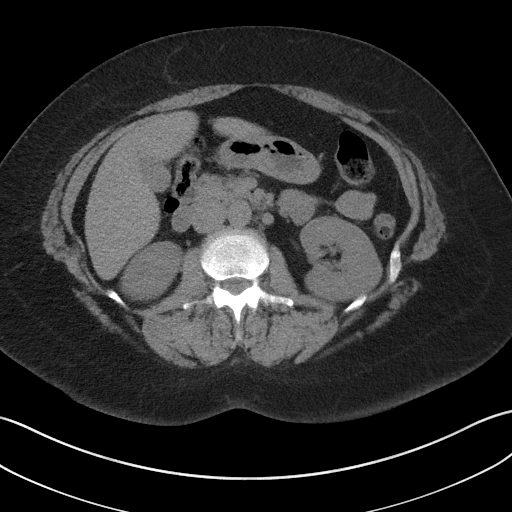
[im 66/87  soft-tissue]
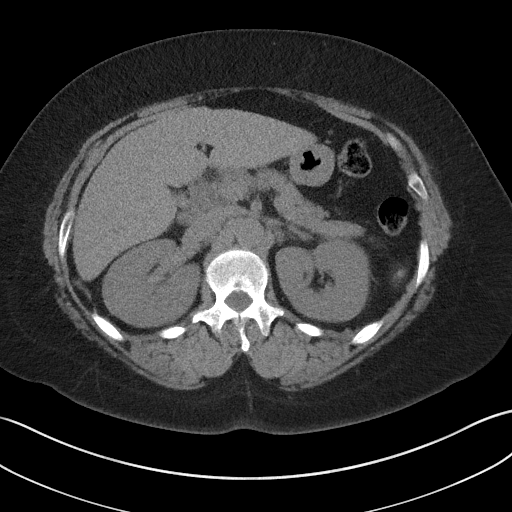
[im 69/87  soft-tissue]
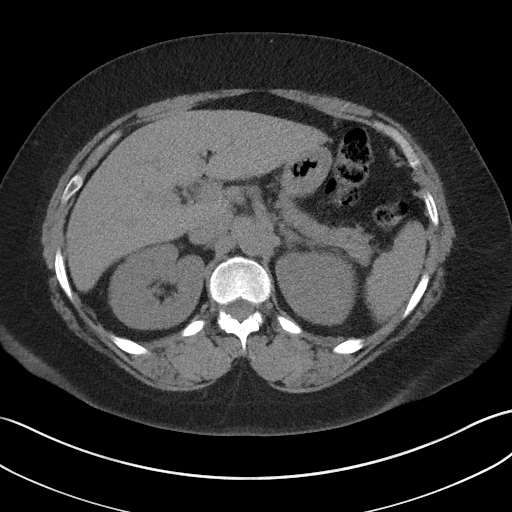
[im 76/87  soft-tissue]
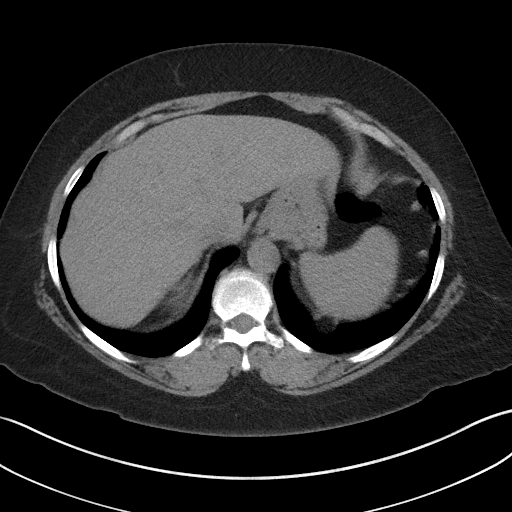
[im 83/87  soft-tissue]
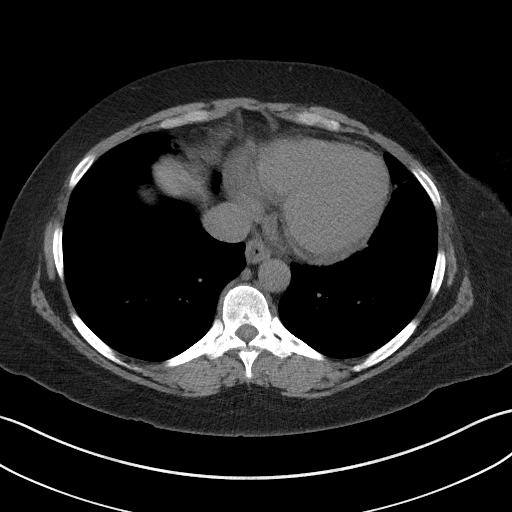

[Series 5: coronal · coronal · 0.78mm/px · 3 of 151 slices shown]
[im 51/151  soft-tissue]
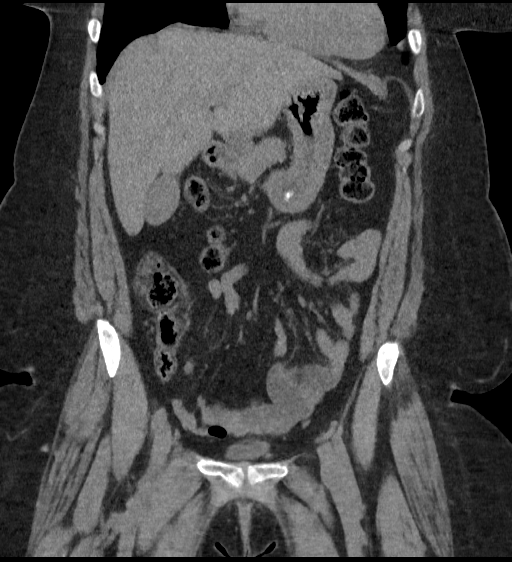
[im 67/151  soft-tissue]
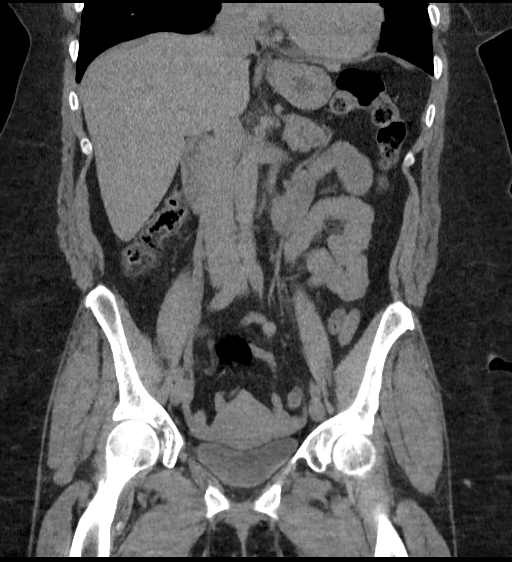
[im 84/151  soft-tissue]
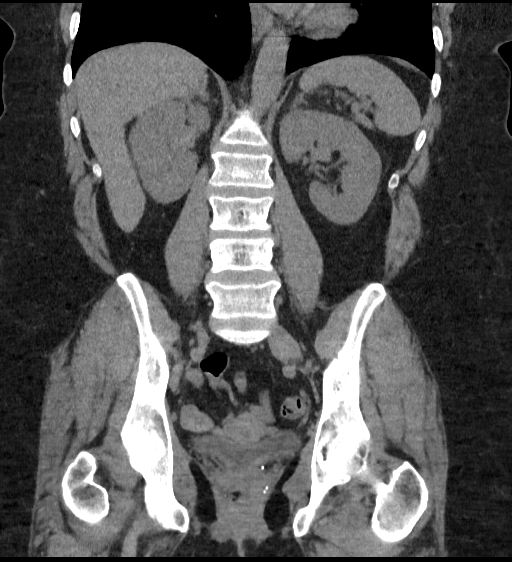

[17 of 46 positions shown; findings below may reference images not displayed]

FINDINGS: Lower chest: Lung bases are clear. No effusions. Heart is normal
size.

Hepatobiliary: Small layering gallstone within the gallbladder. No
focal hepatic abnormality.

Pancreas: No focal abnormality or ductal dilatation.

Spleen: No focal abnormality.  Normal size.

Adrenals/Urinary Tract: No adrenal abnormality. No focal renal
abnormality. No stones or hydronephrosis. Urinary bladder is
unremarkable.

Stomach/Bowel: Normal appendix. Stomach, large and small bowel
grossly unremarkable.

Vascular/Lymphatic: No evidence of aneurysm or adenopathy.

Reproductive: Uterus and adnexa unremarkable.  No mass.

Other: No free fluid or free air.

Musculoskeletal: No acute bony abnormality.
IMPRESSION: No renal or ureteral stones.  No hydronephrosis.

No acute findings.

Cholelithiasis.

## 2022-05-11 DIAGNOSIS — Z23 Encounter for immunization: Secondary | ICD-10-CM | POA: Diagnosis not present

## 2022-05-11 DIAGNOSIS — L089 Local infection of the skin and subcutaneous tissue, unspecified: Secondary | ICD-10-CM | POA: Diagnosis not present

## 2022-05-11 DIAGNOSIS — S61451A Open bite of right hand, initial encounter: Secondary | ICD-10-CM | POA: Diagnosis not present

## 2022-05-11 DIAGNOSIS — W5501XA Bitten by cat, initial encounter: Secondary | ICD-10-CM | POA: Diagnosis not present

## 2022-10-26 ENCOUNTER — Other Ambulatory Visit: Payer: Self-pay

## 2022-10-26 ENCOUNTER — Ambulatory Visit (INDEPENDENT_AMBULATORY_CARE_PROVIDER_SITE_OTHER): Payer: BC Managed Care – PPO | Admitting: Nurse Practitioner

## 2022-10-26 ENCOUNTER — Ambulatory Visit
Admission: RE | Admit: 2022-10-26 | Discharge: 2022-10-26 | Disposition: A | Discharge status: Home / Self Care | Payer: BC Managed Care – PPO | Attending: Nurse Practitioner | Admitting: Nurse Practitioner

## 2022-10-26 ENCOUNTER — Ambulatory Visit
Admission: RE | Admit: 2022-10-26 | Discharge: 2022-10-26 | Disposition: A | Discharge status: Home / Self Care | Payer: BC Managed Care – PPO | Source: Ambulatory Visit | Attending: Nurse Practitioner

## 2022-10-26 ENCOUNTER — Encounter: Payer: Self-pay | Admitting: Nurse Practitioner

## 2022-10-26 VITALS — BP 124/82 | HR 100 | Temp 98.0°F | Resp 16 | Ht 67.0 in | Wt 261.1 lb

## 2022-10-26 DIAGNOSIS — Z131 Encounter for screening for diabetes mellitus: Secondary | ICD-10-CM

## 2022-10-26 DIAGNOSIS — Z1231 Encounter for screening mammogram for malignant neoplasm of breast: Secondary | ICD-10-CM

## 2022-10-26 DIAGNOSIS — Z1159 Encounter for screening for other viral diseases: Secondary | ICD-10-CM | POA: Diagnosis not present

## 2022-10-26 DIAGNOSIS — J4520 Mild intermittent asthma, uncomplicated: Secondary | ICD-10-CM | POA: Diagnosis not present

## 2022-10-26 DIAGNOSIS — Z122 Encounter for screening for malignant neoplasm of respiratory organs: Secondary | ICD-10-CM

## 2022-10-26 DIAGNOSIS — Z130 Encounter for screening for diseases of the blood and blood-forming organs and certain disorders involving the immune mechanism: Secondary | ICD-10-CM

## 2022-10-26 DIAGNOSIS — E785 Hyperlipidemia, unspecified: Secondary | ICD-10-CM | POA: Diagnosis not present

## 2022-10-26 DIAGNOSIS — R053 Chronic cough: Secondary | ICD-10-CM | POA: Diagnosis not present

## 2022-10-26 DIAGNOSIS — R0602 Shortness of breath: Secondary | ICD-10-CM | POA: Diagnosis not present

## 2022-10-26 DIAGNOSIS — R059 Cough, unspecified: Secondary | ICD-10-CM | POA: Diagnosis not present

## 2022-10-26 DIAGNOSIS — Z114 Encounter for screening for human immunodeficiency virus [HIV]: Secondary | ICD-10-CM

## 2022-10-26 MED ORDER — VENTOLIN HFA 108 (90 BASE) MCG/ACT IN AERS: 1.0000 | INHALATION_SPRAY | RESPIRATORY_TRACT | 5 refills | Status: AC | PRN

## 2022-10-26 MED ORDER — TRELEGY ELLIPTA 100-62.5-25 MCG/ACT IN AEPB
1.0000 | INHALATION_SPRAY | Freq: Every day | RESPIRATORY_TRACT | 11 refills | Status: AC
Start: 2022-10-26 — End: ?

## 2022-10-26 NOTE — Assessment & Plan Note (Signed)
Continue ventolin,  start trelelgy, will get chest xray due to increased cough and shortness of breath

## 2022-10-26 NOTE — Assessment & Plan Note (Signed)
Getting labs 

## 2022-10-26 NOTE — Progress Notes (Signed)
BP 124/82   Pulse 100   Temp 98 F (36.7 C) (Oral)   Resp 16   Ht 5\' 7"  (1.702 m)   Wt 261 lb 1.6 oz (118.4 kg)   SpO2 96%   BMI 40.89 kg/m    Subjective:    Patient ID: Sheila Sanders, female    DOB: [birth year], 57 y.o.   MRN: [id number]  HPI: Sheila Sanders is a 57 y.o. female  Chief Complaint  Patient presents with   Asthma    Follow up, medication refill   Cough    Congestion for 2 months   Asthma:    -Asthma status: uncontrolled, was doing okay but has had to use it all the time over the last two months, with increase of shortness of breath will get chest xray, will also add on trelegy. She was doing QVAR but says it did not really help.   -Current Treatments: ventolin -Satisfied with current treatment?: no -Albuterol/rescue inhaler frequency: every day -Dyspnea frequency: daily -Wheezing frequency:daily -Cough frequency: daily -Nocturnal symptom frequency:  -Limitation of activity: yes -Current upper respiratory symptoms: yes -Triggers: illness -Aerochamber/spacer use: no -Visits to ER or Urgent Care in past year: no -Pneumovax: Not up to Date -Influenza: Not up to Date   HLD:  -Medications: none -Patient is working on lifestyle modification -Last lipid panel:  Lipid Panel     Component Value Date/Time   CHOL 208 (H) 09/19/2020 1429   TRIG 89 09/19/2020 1429   HDL 71 09/19/2020 1429   CHOLHDL 2.9 09/19/2020 1429   LDLCALC 118 (H) 09/19/2020 1429    Relevant past medical, surgical, family and social history reviewed and updated as indicated. Interim medical history since our last visit reviewed. Allergies and medications reviewed and updated.  Review of Systems  Constitutional: Negative for fever or weight change.  Respiratory: positive for cough and shortness of breath.   Cardiovascular: Negative for chest pain or palpitations.  Gastrointestinal: Negative for abdominal pain, no bowel changes.  Musculoskeletal: Negative for gait problem  or joint swelling.  Skin: Negative for rash.  Neurological: Negative for dizziness or headache.  No other specific complaints in a complete review of systems (except as listed in HPI above).       Objective:    BP 124/82   Pulse 100   Temp 98 F (36.7 C) (Oral)   Resp 16   Ht 5\' 7"  (1.702 m)   Wt 261 lb 1.6 oz (118.4 kg)   SpO2 96%   BMI 40.89 kg/m   Wt Readings from Last 3 Encounters:  10/26/22 261 lb 1.6 oz (118.4 kg)  06/26/21 266 lb 4.8 oz (120.8 kg)  04/10/21 262 lb 3.2 oz (118.9 kg)    Physical Exam  Constitutional: Patient appears well-developed and well-nourished. Obese No distress.  HEENT: head atraumatic, normocephalic, pupils equal and reactive to light, ears TMs clear, neck supple, throat within normal limits Cardiovascular: Normal rate, regular rhythm and normal heart sounds.  No murmur heard. No BLE edema. Pulmonary/Chest: Effort normal and breath sounds mild wheezing. No respiratory distress. Abdominal: Soft.  There is no tenderness. Psychiatric: Patient has a normal mood and affect. behavior is normal. Judgment and thought content normal.  Results for orders placed or performed in visit on 09/19/20  COMPLETE METABOLIC PANEL WITH GFR  Result Value Ref Range   Glucose, Bld 81 65 - 99 mg/dL   BUN 9 7 - 25 mg/dL   Creat 1.19 (L) 1.47 - 1.03 mg/dL  eGFR 114 > OR = 60 mL/min/1.33m2   BUN/Creatinine Ratio 19 6 - 22 (calc)   Sodium 140 135 - 146 mmol/L   Potassium 3.9 3.5 - 5.3 mmol/L   Chloride 105 98 - 110 mmol/L   CO2 25 20 - 32 mmol/L   Calcium 9.4 8.6 - 10.4 mg/dL   Total Protein 7.5 6.1 - 8.1 g/dL   Albumin 4.4 3.6 - 5.1 g/dL   Globulin 3.1 1.9 - 3.7 g/dL (calc)   AG Ratio 1.4 1.0 - 2.5 (calc)   Total Bilirubin 0.4 0.2 - 1.2 mg/dL   Alkaline phosphatase (APISO) 81 37 - 153 U/L   AST 12 10 - 35 U/L   ALT 9 6 - 29 U/L  Lipid panel  Result Value Ref Range   Cholesterol 208 (H) <200 mg/dL   HDL 71 > OR = 50 mg/dL   Triglycerides 89 <540 mg/dL   LDL  Cholesterol (Calc) 118 (H) mg/dL (calc)   Total CHOL/HDL Ratio 2.9 <5.0 (calc)   Non-HDL Cholesterol (Calc) 137 (H) <130 mg/dL (calc)  TSH  Result Value Ref Range   TSH 1.11 mIU/L  Hemoglobin A1c  Result Value Ref Range   Hgb A1c MFr Bld 5.2 <5.7 % of total Hgb   Mean Plasma Glucose 103 mg/dL   eAG (mmol/L) 5.7 mmol/L  CBC with Differential/Platelet  Result Value Ref Range   WBC 7.5 3.8 - 10.8 Thousand/uL   RBC 5.40 (H) 3.80 - 5.10 Million/uL   Hemoglobin 14.2 11.7 - 15.5 g/dL   HCT 98.1 19.1 - 47.8 %   MCV 78.3 (L) 80.0 - 100.0 fL   MCH 26.3 (L) 27.0 - 33.0 pg   MCHC 33.6 32.0 - 36.0 g/dL   RDW 29.5 (H) 62.1 - 30.8 %   Platelets 270 140 - 400 Thousand/uL   MPV 9.9 7.5 - 12.5 fL   Neutro Abs 4,755 1,500 - 7,800 cells/uL   Lymphs Abs 1,943 850 - 3,900 cells/uL   Absolute Monocytes 668 200 - 950 cells/uL   Eosinophils Absolute 98 15 - 500 cells/uL   Basophils Absolute 38 0 - 200 cells/uL   Neutrophils Relative % 63.4 %   Total Lymphocyte 25.9 %   Monocytes Relative 8.9 %   Eosinophils Relative 1.3 %   Basophils Relative 0.5 %      Assessment & Plan:   Problem List Items Addressed This Visit       Respiratory   Mild intermittent asthma - Primary    Continue ventolin,  start trelelgy, will get chest xray due to increased cough and shortness of breath      Relevant Medications   VENTOLIN HFA 108 (90 Base) MCG/ACT inhaler   Fluticasone-Umeclidin-Vilant (TRELEGY ELLIPTA) 100-62.5-25 MCG/ACT AEPB     Other   Hyperlipidemia    Getting labs      Relevant Orders   Lipid panel   Other Visit Diagnoses     Screening for diabetes mellitus       Relevant Orders   COMPLETE METABOLIC PANEL WITH GFR   Hemoglobin A1c   Screening for deficiency anemia       Relevant Orders   CBC with Differential/Platelet   Screening for HIV without presence of risk factors       Relevant Orders   HIV Antibody (routine testing w rflx)   Encounter for hepatitis C screening test for low  risk patient       Relevant Orders   Hepatitis C antibody  Breast cancer screening by mammogram       Relevant Orders   MM 3D SCREENING MAMMOGRAM BILATERAL BREAST   Encounter for screening for lung cancer       Relevant Orders   Ambulatory Referral Lung Cancer Screening [facility]   Persistent cough       start trelegy,  chest xray, based on results will consider antibiotic vs steroid taper   Relevant Orders   DG Chest 2 View         Follow up plan: Return in about 6 months (around 04/26/2023) for follow up.

## 2022-10-27 LAB — HEMOGLOBIN A1C
Hgb A1c MFr Bld: 5.6 %{Hb} (ref <5.7)
Mean Plasma Glucose: 114 mg/dL
eAG (mmol/L): 6.3 mmol/L

## 2022-10-27 LAB — LIPID PANEL
Cholesterol: 165 mg/dL (ref <200)
HDL: 69 mg/dL (ref >=50)
LDL Cholesterol (Calc): 80 mg/dL
Non-HDL Cholesterol (Calc): 96 mg/dL (ref <130)
Total CHOL/HDL Ratio: 2.4 (calc) (ref <5.0)
Triglycerides: 77 mg/dL (ref <150)

## 2022-10-27 LAB — COMPLETE METABOLIC PANEL WITH GFR
AG Ratio: 1.3 (calc) (ref 1.0–2.5)
ALT: 9 U/L (ref 6–29)
AST: 11 U/L (ref 10–35)
Albumin: 4.1 g/dL (ref 3.6–5.1)
Alkaline phosphatase (APISO): 84 U/L (ref 37–153)
BUN: 9 mg/dL (ref 7–25)
CO2: 24 mmol/L (ref 20–32)
Calcium: 9.5 mg/dL (ref 8.6–10.4)
Chloride: 107 mmol/L (ref 98–110)
Creat: 0.57 mg/dL (ref 0.50–1.03)
Globulin: 3.1 g/dL (ref 1.9–3.7)
Glucose, Bld: 92 mg/dL (ref 65–99)
Potassium: 3.8 mmol/L (ref 3.5–5.3)
Sodium: 142 mmol/L (ref 135–146)
Total Bilirubin: 0.4 mg/dL (ref 0.2–1.2)
Total Protein: 7.2 g/dL (ref 6.1–8.1)
eGFR: 107 mL/min/{1.73_m2} (ref >=60)

## 2022-10-27 LAB — CBC WITH DIFFERENTIAL/PLATELET
Absolute Monocytes: 538 {cells}/uL (ref 200–950)
Basophils Absolute: 28 {cells}/uL (ref 0–200)
Basophils Relative: 0.4 %
Eosinophils Absolute: 69 {cells}/uL (ref 15–500)
Eosinophils Relative: 1 %
HCT: 41.3 % (ref 35.0–45.0)
Hemoglobin: 13.4 g/dL (ref 11.7–15.5)
Lymphs Abs: 1891 {cells}/uL (ref 850–3900)
MCH: 25.9 pg — ABNORMAL LOW (ref 27.0–33.0)
MCHC: 32.4 g/dL (ref 32.0–36.0)
MCV: 79.9 fL — ABNORMAL LOW (ref 80.0–100.0)
MPV: 10.1 fL (ref 7.5–12.5)
Monocytes Relative: 7.8 %
Neutro Abs: 4375 {cells}/uL (ref 1500–7800)
Neutrophils Relative %: 63.4 %
Platelets: 288 10*3/uL (ref 140–400)
RBC: 5.17 10*6/uL — ABNORMAL HIGH (ref 3.80–5.10)
RDW: 15.2 % — ABNORMAL HIGH (ref 11.0–15.0)
Total Lymphocyte: 27.4 %
WBC: 6.9 10*3/uL (ref 3.8–10.8)

## 2022-10-27 LAB — HEPATITIS C ANTIBODY: Hepatitis C Ab: NONREACTIVE

## 2022-10-27 LAB — HIV ANTIBODY (ROUTINE TESTING W REFLEX): HIV 1&2 Ab, 4th Generation: NONREACTIVE

## 2023-06-16 DIAGNOSIS — R350 Frequency of micturition: Secondary | ICD-10-CM | POA: Diagnosis not present

## 2023-06-16 DIAGNOSIS — N390 Urinary tract infection, site not specified: Secondary | ICD-10-CM | POA: Diagnosis not present

## 2023-11-22 ENCOUNTER — Other Ambulatory Visit: Payer: Self-pay | Admitting: Nurse Practitioner

## 2023-11-22 DIAGNOSIS — J4520 Mild intermittent asthma, uncomplicated: Secondary | ICD-10-CM

## 2023-11-23 NOTE — Telephone Encounter (Signed)
 Requested medication (s) are due for refill today: Yes  Requested medication (s) are on the active medication list: Yes  Last refill:    Future visit scheduled: No  Notes to clinic:  Voice mailbox full, unable to leave message to make appointment for follow up.    Requested Prescriptions  Pending Prescriptions Disp Refills   TRELEGY ELLIPTA  100-62.5-25 MCG/ACT AEPB [Pharmacy Med Name: TRELEGY ELLIPTA  100-62. INH 30P] 60 each     Sig: Inhale 1 puff into the lungs daily.     Off-Protocol Failed - 11/23/2023  2:54 PM      Failed - Medication not assigned to a protocol, review manually.      Failed - Valid encounter within last 12 months    Recent Outpatient Visits   None
